# Patient Record
Sex: Male | Born: 1985 | Race: Black or African American | Hispanic: No | Marital: Single | Smoking: Light tobacco smoker
Health system: Southern US, Community
[De-identification: ages and names within clinical notes are randomized; demographics above are authoritative.]

## PROBLEM LIST (undated history)

## (undated) HISTORY — PX: ADENOIDECTOMY: SUR15

## (undated) HISTORY — PX: EYE SURGERY: SHX253

---
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
## 2008-07-29 ENCOUNTER — Emergency Department (HOSPITAL_COMMUNITY): Admission: EM | Admit: 2008-07-29 | Discharge: 2008-07-29 | Payer: Self-pay | Admitting: Emergency Medicine

## 2009-09-25 ENCOUNTER — Emergency Department (HOSPITAL_COMMUNITY): Admission: EM | Admit: 2009-09-25 | Discharge: 2009-09-25 | Payer: Self-pay | Admitting: Emergency Medicine

## 2011-02-18 ENCOUNTER — Emergency Department (HOSPITAL_COMMUNITY)
Admission: EM | Admit: 2011-02-18 | Discharge: 2011-02-18 | Disposition: A | Discharge status: Home / Self Care | Payer: Self-pay | Attending: Emergency Medicine | Admitting: Emergency Medicine

## 2011-02-18 DIAGNOSIS — M542 Cervicalgia: Secondary | ICD-10-CM | POA: Insufficient documentation

## 2011-02-18 DIAGNOSIS — R070 Pain in throat: Secondary | ICD-10-CM | POA: Insufficient documentation

## 2011-02-18 DIAGNOSIS — M25519 Pain in unspecified shoulder: Secondary | ICD-10-CM | POA: Insufficient documentation
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
## 2011-09-17 LAB — CULTURE, ROUTINE-ABSCESS

## 2014-10-29 ENCOUNTER — Encounter (HOSPITAL_BASED_OUTPATIENT_CLINIC_OR_DEPARTMENT_OTHER): Payer: Self-pay | Admitting: Emergency Medicine

## 2014-10-29 ENCOUNTER — Emergency Department (HOSPITAL_BASED_OUTPATIENT_CLINIC_OR_DEPARTMENT_OTHER)
Admission: EM | Admit: 2014-10-29 | Discharge: 2014-10-29 | Disposition: A | Discharge status: Home / Self Care | Payer: Self-pay | Attending: Emergency Medicine | Admitting: Emergency Medicine

## 2014-10-29 DIAGNOSIS — R110 Nausea: Secondary | ICD-10-CM | POA: Insufficient documentation

## 2014-10-29 DIAGNOSIS — H9209 Otalgia, unspecified ear: Secondary | ICD-10-CM | POA: Insufficient documentation

## 2014-10-29 DIAGNOSIS — X58XXXA Exposure to other specified factors, initial encounter: Secondary | ICD-10-CM | POA: Insufficient documentation

## 2014-10-29 DIAGNOSIS — Z791 Long term (current) use of non-steroidal anti-inflammatories (NSAID): Secondary | ICD-10-CM | POA: Insufficient documentation

## 2014-10-29 DIAGNOSIS — Y998 Other external cause status: Secondary | ICD-10-CM | POA: Insufficient documentation

## 2014-10-29 DIAGNOSIS — S025XXA Fracture of tooth (traumatic), initial encounter for closed fracture: Secondary | ICD-10-CM | POA: Insufficient documentation

## 2014-10-29 DIAGNOSIS — Y9389 Activity, other specified: Secondary | ICD-10-CM | POA: Insufficient documentation

## 2014-10-29 DIAGNOSIS — K0889 Other specified disorders of teeth and supporting structures: Secondary | ICD-10-CM

## 2014-10-29 DIAGNOSIS — K029 Dental caries, unspecified: Secondary | ICD-10-CM | POA: Insufficient documentation

## 2014-10-29 DIAGNOSIS — R51 Headache: Secondary | ICD-10-CM | POA: Insufficient documentation

## 2014-10-29 DIAGNOSIS — Y9289 Other specified places as the place of occurrence of the external cause: Secondary | ICD-10-CM | POA: Insufficient documentation

## 2014-10-29 MED ORDER — TRAMADOL HCL 50 MG PO TABS: 50.0000 mg | ORAL_TABLET | Freq: Four times a day (QID) | ORAL | Status: DC | PRN

## 2014-10-29 MED ORDER — NAPROXEN 375 MG PO TABS: 375.0000 mg | ORAL_TABLET | Freq: Two times a day (BID) | ORAL | Status: DC | PRN
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
## 2014-10-29 NOTE — ED Provider Notes (Signed)
CSN: [id number]     Arrival date & time 10/29/14  1805 History   This chart was scribed for Clinton SkeensJoshua M Demarqus Jocson, MD by Evon Slackerrance Branch, ED Scribe. This patient was seen in room MH04/MH04 and the patient's care was started at 6:07 PM.      Chief Complaint  Patient presents with  . Dental Injury   Patient is a 28 y.o. male presenting with dental injury. The history is provided by the patient. No language interpreter was used.  Dental Injury Associated symptoms include headaches.   HPI Comments: [name] is a 28 y.o. male who presents to the Emergency Department complaining of upper right dental pain onset today after eating pizza. He states that there was a bone in the tooth that stabbed him in his tooth. He states that he had associated nausea, headache and ear pain. He states that he was given pain medication by EMS that has provided him some relief. Denies fever chills or vomiting.    History reviewed. No pertinent past medical history. Past Surgical History  Procedure Laterality Date  . Tonsillectomy     No family history on file. History  Substance Use Topics  . Smoking status: Never Smoker   . Smokeless tobacco: Not on file  . Alcohol Use: No    Review of Systems  Constitutional: Negative for fever and chills.  HENT: Positive for dental problem and ear pain.   Gastrointestinal: Positive for nausea. Negative for vomiting.  Neurological: Positive for headaches.  All other systems reviewed and are negative.   Allergies  Review of patient's allergies indicates no known allergies.  Home Medications   Prior to Admission medications   Medication Sig Start Date End Date Taking? Authorizing Provider  naproxen (NAPROSYN) 375 MG tablet Take 1 tablet (375 mg total) by mouth 2 (two) times daily as needed. 10/29/14   Clinton SkeensJoshua M Kalila Adkison, MD  traMADol (ULTRAM) 50 MG tablet Take 1 tablet (50 mg total) by mouth every 6 (six) hours as needed. 10/29/14   Clinton SkeensJoshua M Jazzmin Newbold, MD   Triage  Vitals: BP 135/69 mmHg  Pulse 92  Temp(Src) 98.6 F (37 C) (Oral)  Resp 20  Ht 5\' 8"  (1.727 m)  Wt 285 lb (129.275 kg)  BMI 43.34 kg/m2  SpO2 100%  Physical Exam  Constitutional: He is oriented to person, place, and time. He appears well-developed and well-nourished. No distress.  HENT:  Head: Normocephalic and atraumatic.  Mouth/Throat: No trismus in the jaw. Dental caries present.  Multiple cavities, fractured tooth 2nd molar on right upper, no significant swelling, no bleeding    Eyes: Conjunctivae and EOM are normal.  Neck: Neck supple. No tracheal deviation present.  Cardiovascular: Normal rate.   Pulmonary/Chest: Effort normal. No respiratory distress.  Musculoskeletal: Normal range of motion.  Neurological: He is alert and oriented to person, place, and time.  Skin: Skin is warm and dry.  Psychiatric: He has a normal mood and affect. His behavior is normal.  Nursing note and vitals reviewed.   ED Course  Procedures (including critical care time) DIAGNOSTIC STUDIES: Oxygen Saturation is 100% on RA, normal by my interpretation.    Labs Review Labs Reviewed - No data to display  Imaging Review No results found.   EKG Interpretation None      MDM   Final diagnoses:  Fracture, tooth, closed, initial encounter  Pain, dental    I personally performed the services described in this documentation, which was scribed in my presence. The  recorded information has been reviewed and is accurate.  Results and differential diagnosis were discussed with the patient/parent/guardian. Close follow up outpatient was discussed, comfortable with the plan.   Medications - No data to display  Filed Vitals:   10/29/14 1759 10/29/14 1948  BP: 135/69 129/73  Pulse: 92 88  Temp: 98.6 F (37 C) 98.3 F (36.8 C)  TempSrc: Oral Oral  Resp: 20 18  Height: 5\' 8"  (1.727 m)   Weight: 285 lb (129.275 kg)   SpO2: 100% 100%    Final diagnoses:  Fracture, tooth, closed, initial  encounter  Pain, dental      Clinton SkeensJoshua M Jacoba Cherney, MD 10/30/14 [phone]

## 2014-10-29 NOTE — Discharge Instructions (Signed)
°Emergency Department Resource Guide °1) Find a Doctor and Pay Out of Pocket °Although you won't have to find out who is covered by your insurance plan, it is a good idea to ask around and get recommendations. You will then need to call the office and see if the doctor you have chosen will accept you as a new patient and what types of options they offer for patients who are self-pay. Some doctors offer discounts or will set up payment plans for their patients who do not have insurance, but you will need to ask so you aren't surprised when you get to your appointment. ° °2) Contact Your Local Health Department °Not all health departments have doctors that can see patients for sick visits, but many do, so it is worth a call to see if yours does. If you don't know where your local health department is, you can check in your phone book. The CDC also has a tool to help you locate your state's health department, and many state websites also have listings of all of their local health departments. ° °3) Find a Walk-in Clinic °If your illness is not likely to be very severe or complicated, you may want to try a walk in clinic. These are popping up all over the country in pharmacies, drugstores, and shopping centers. They're usually staffed by nurse practitioners or physician assistants that have been trained to treat common illnesses and complaints. They're usually fairly quick and inexpensive. However, if you have serious medical issues or chronic medical problems, these are probably not your best option. ° °No Primary Care Doctor: °- Call [facility] at  832-8000 - they can help you locate a primary care doctor that  accepts your insurance, provides certain services, etc. °- Physician Referral Service- 1-800-533-3463 ° °Chronic Pain Problems: °Organization         Address  Phone   Notes  °[facility]  (336) 297-2271 Patients need to be referred by their primary care doctor.  ° °Medication  Assistance: °Organization         Address  Phone   Notes  °[place] [facility] [address] °[place], [place] [address] (336) 641-8030 --Must be a resident of [place] °-- Must have NO insurance coverage whatsoever (no Medicaid/ Medicare, etc.) °-- The pt. MUST have a primary care doctor that directs their care regularly and follows them in the community °  °[facility]  (866) 331-1348   °[employer]  (888) 892-1162   ° °Agencies that provide inexpensive medical care: °Organization         Address  Phone   Notes  °[facility]  (336) 832-8035   °[facility]    (336) 832-7272   °[facility] [address] °[place],  [address] (336) 832-4777   °[facility] [address], °[place] (336) 271-4999   °[facility]    (336) 373-0678   °[facility]    (336) 272-1050   °[facility] ° [address], [place] Phone:  (336) 832-4444, Fax:  (336) 832-4440 Hours of Operation:  9 am - 6 pm, M-F.  Also accepts Medicaid/Medicare and self-pay.  °[facility] ° [address],  Phone: (336) 832-3150, Fax: (336) 832-3151. Hours of Operation:  8:30 am - 5:30 pm, M-F.  Also accepts Medicaid and self-pay.  °[facility] [place] [address], [place] Phone: (336) 878-6027   °[facility] [address], [place], [place] (336)723-1848, Ext. 123 Mondays & Thursdays: 7-9 AM.  First 15 patients are seen on a first come, first serve basis. °  ° °Medicaid-accepting [place] Providers: ° °Organization         Address  Phone   Notes  °[facility] [address], [place] (336) 641-2100 Also accepts self-pay patients.  °[facility] [address], [place] ° (336) 856-9996   °[facility] [address], [place]  (336) 288-8857   °[facility] [address] [place] [address], [place] (336) 299-7000   °Veita Bland [address], [place]  ° (336) 373-1557 Only accepts [place] Access Medicaid patients after they have their name applied to their card.  ° °Self-Pay (no insurance) in [place]: ° °Organization         Address  Phone   Notes  °Sickle Cell Patients, [facility] [address], [place] (336) 832-1970   °[facility] [address], [place] (336) 832-4400   °[facility] ° [address], [place] (336) 992-4800   °[facility]/Dr. Osei-Bonsu ° [address] [place] [address], [place] or [address], [place] (336) 841-8500 Phone number for both [place] and [place] locations is the same.  °[facility] [address], [place] (336) 299-0000   °[facility] [address] [place] [address], [place] or [address] (336) 852-7530 °(336) 878-2260   °[facility] [address], [place] (336) 350-1642, phone; (336) 294-5005, fax Sees patients 1st and 3rd Saturday of every month.  Must not qualify for public or private insurance (i.e. Medicaid, Medicare, [place] Health Choice, Veterans' Benefits) • Household income should be no more than 200% of the poverty level •The clinic cannot treat you if you are pregnant or think you are pregnant • Sexually transmitted diseases are not treated at the clinic.  ° ° °Dental Care: °Organization         Address  Phone  Notes  °[place] [facility] [address], [place] (336) 641-6152 Accepts children up to age 21 who are enrolled in Medicaid or [place] Health Choice; pregnant women with a Medicaid card; and children who have applied for Medicaid or [place] Health Choice, but were declined, whose parents can pay a reduced fee at time of service.  °[place]  [facility] [place]  [address], [place] (336) 641-7733 Accepts children up to age 21 who are enrolled in Medicaid or [place] Health Choice; pregnant women with a Medicaid card; and children who have applied for Medicaid or [place] Health Choice, but were declined, whose parents can pay a reduced fee at time of service.  °[facility] ° [address], [place] (336) 641-4533 Patients are seen by appointment only. Walk-ins are not accepted. [facility] will see patients 18 years of age and older. °Monday - Tuesday (8am-5pm) °Most Wednesdays (8:30-5pm) °$30 per visit, cash only  °[facility] ° [address], [place] (336) 641-4533 Patients are seen by appointment only. Walk-ins are not accepted. [facility] will see patients 18 years of age and older. °One   Wednesday Evening (Monthly: Volunteer Based).  $30 per visit, cash only  °[facility]  (919) 537-3737 for adults; Children under age 4, call Graduate Pediatric Dentistry at (919) [phone]. Children aged 4-14, please call (919) [phone] to request a pediatric application. ° Dental services are provided in all areas of dental care including fillings, crowns and bridges, complete and partial dentures, implants, gum treatment, root canals, and extractions. Preventive care is also provided. Treatment is provided to both adults and children. °Patients are selected via a lottery and there is often a waiting list. °  °[facility] [address], °[place] ° (336) 763-8833 [email or web address] °  °[facility] [address], [place], [place] (336)723-1848, Ext. 123 Second and Fourth Thursday of each month, opens at 6:30 AM; Clinic ends at 9 AM.  Patients are seen on a first-come first-served basis, and a limited number are seen during each clinic.  ° °[facility] ° [address], [place], [place] (336) 723-7904    Eligibility Requirements °You must have lived in [place], or [place] counties for at least the last three months. °  You cannot be eligible for state or federal sponsored healthcare insurance, including Veterans Administration, Medicaid, or Medicare. °  You generally cannot be eligible for healthcare insurance through your employer.  °  How to apply: °Eligibility screenings are held every Tuesday and Wednesday afternoon from 1:00 pm until 4:00 pm. You do not need an appointment for the interview!  °[facility] [address], [place], [place] 336-631-2330   °[facility]  336-342-8273   °[facility]  336-703-3100   °[facility]  336-570-6415   ° °Behavioral Health Resources in the Community: °Intensive Outpatient Programs °Organization         Address  Phone  Notes  °[facility] [address], [place], [place] 336-878-6098   °[facility] [address], [place], [place] 336-832-9800   °[facility] [address], [place], [place] ° 336-882-2125   °[facility] [address],  °[place], [place] 1-800-853-5163 or 336-641-4981   °Substance Abuse Resources °Organization         Address  Phone  Notes  °[facility]  336-882-2125   °[facility]  336-784-9470   °[facility]  336-285-9073   °[facility]  336-845-3988   °Residential & Outpatient Substance Abuse Program  1-800-659-3381   °Psychological Services °Organization         Address  Phone  Notes  °[facility]  336- 832-9600   °[facility]  336- 378-7881   °[facility] [address], [place] 1-800-853-5163 or 336-641-4981   ° °Mobile Crisis Teams °Organization         Address  Phone  Notes  °[facility], Mobile Crisis Care Unit  1-877-626-1772   °Assertive °[facility] ° [address]  [place], [place] 336-834-9664   °Sharon DeEsch [address] °[place] 336-554-5454   ° °Self-Help/Support Groups °Organization         Address  Phone             Notes  °[facility] - variety of support groups  336- 373-1402 Call for more information  °Narcotics Anonymous (NA), [facility] [address], °[place]   2 meetings at this location  ° °  Residential Treatment Programs °Organization         Address  Phone  Notes  °[facility] [address],    °[place]  1-866-801-8205   °[facility] ° [address], [place], [place] 704-293-8524   °[facility] [address], [place] 336-845-3988 Admissions: 8am-3pm M-F  °[facility] [address]    °[place], [place] 336-841-1104   °[facility] [address], [place], [place] 336-379-7146   °[facility] [address]  °[place], [place] 336-285-9073   °[facility] Intensive Outpatient [address], [place], [place] 336-852-3033   °[facility] [address]  °[place], [place] 1-877-615-2722 or 336-784-9470   °[facility] [address] [place], [place] 336-227-7417 Accepts Medicaid  °[facility] [address]  °[place] 1-800-659-3381 Substance Abuse/Addiction Treatment  ° °[place] Behavioral Health Resources °Organization         Address  Phone  Notes  °[facility]  (888) 581-9988   °Julie Brannon, PhD [address] [place], [place]   (336) 349-5553 or (336) 951-0000   °[facility]   [address] °[place], [place] (336) 349-4454   °[facility] [address], [place], [place] (336) 342-8316 Insurance/Medicaid/sponsorship through [facility]  °[facility] [address]                                    [place], [place] (336) 342-8316 Therapy/tele-psych/case    °[facility] [address]  ° [place], [place] (336) 349-2233    °Dr. Arfeen  (336) 349-4544   °[facility] [place]  [employer] [place] [facility] 1) [address], [place] °2) [address], [place] °3)  [address], [place] (336) 349-3220 °(336) 342-7768 ° °(336) 342-8140   °[place] [facility] (336) 342-1394 or (336) 342-3537 (After Hours)    ° ° °

## 2014-10-29 NOTE — ED Notes (Signed)
Pt discharged to home with family. NAD.  

## 2014-10-29 NOTE — ED Notes (Signed)
Pre hosp.  IV  RT  AC  # 20  Saline lock     Given 50 fentenyl IV per EMS

## 2015-01-21 ENCOUNTER — Emergency Department (HOSPITAL_COMMUNITY)
Admission: EM | Admit: 2015-01-21 | Discharge: 2015-01-21 | Disposition: A | Discharge status: Home / Self Care | Payer: Self-pay | Attending: Emergency Medicine | Admitting: Emergency Medicine

## 2015-01-21 ENCOUNTER — Encounter (HOSPITAL_COMMUNITY): Payer: Self-pay | Admitting: Emergency Medicine

## 2015-01-21 DIAGNOSIS — K011 Impacted teeth: Secondary | ICD-10-CM | POA: Insufficient documentation

## 2015-01-21 DIAGNOSIS — X58XXXD Exposure to other specified factors, subsequent encounter: Secondary | ICD-10-CM | POA: Insufficient documentation

## 2015-01-21 DIAGNOSIS — K029 Dental caries, unspecified: Secondary | ICD-10-CM | POA: Insufficient documentation

## 2015-01-21 DIAGNOSIS — S025XXK Fracture of tooth (traumatic), subsequent encounter for fracture with nonunion: Secondary | ICD-10-CM | POA: Insufficient documentation

## 2015-01-21 MED ORDER — TRAMADOL HCL 50 MG PO TABS
50.0000 mg | ORAL_TABLET | Freq: Once | ORAL | Status: AC
Start: 2015-01-21 — End: 2015-01-21
  Administered 2015-01-21: 50 mg via ORAL
  Filled 2015-01-21: qty 1

## 2015-01-21 MED ORDER — NAPROXEN 375 MG PO TABS: 375.0000 mg | ORAL_TABLET | Freq: Two times a day (BID) | ORAL | Status: DC | PRN

## 2015-01-21 MED ORDER — TRAMADOL HCL 50 MG PO TABS: 50.0000 mg | ORAL_TABLET | Freq: Four times a day (QID) | ORAL | Status: DC | PRN

## 2015-01-21 MED ORDER — NAPROXEN 500 MG PO TABS
500.0000 mg | ORAL_TABLET | Freq: Once | ORAL | Status: AC
  Administered 2015-01-21: 500 mg via ORAL
  Filled 2015-01-21: qty 1

## 2015-01-21 MED ORDER — NAPROXEN 375 MG PO TABS
375.0000 mg | ORAL_TABLET | Freq: Once | ORAL | Status: DC
  Filled 2015-01-21: qty 1

## 2015-01-21 NOTE — ED Provider Notes (Signed)
CSN: [id number]     Arrival date & time 01/21/15  2120 History  This chart was scribed for non-physician practitioner working with Gilda Creasehristopher J. Pollina, * by Elveria Risingimelie Horne, ED Scribe. This patient was seen in room WTR8/WTR8 and the patient's care was started at 9:36 PM.   Chief Complaint  Patient presents with  . Dental Pain   The history is provided by the patient. No language interpreter was used.   HPI Comments: [name] is a 29 y.o. male who presents to the Emergency Department complaining of right dental pain ongoing for four days. Patient does have dental insurance, but reports plans to "walk in" in the morning.   History reviewed. No pertinent past medical history. Past Surgical History  Procedure Laterality Date  . Tonsillectomy     History reviewed. No pertinent family history. History  Substance Use Topics  . Smoking status: Never Smoker   . Smokeless tobacco: Not on file  . Alcohol Use: No    Review of Systems  Constitutional: Negative for fever and chills.  HENT: Positive for dental problem. Negative for sore throat and trouble swallowing.    Allergies  Review of patient's allergies indicates no known allergies.  Home Medications   Prior to Admission medications   Medication Sig Start Date End Date Taking? Authorizing Provider  acetaminophen (TYLENOL) 500 MG tablet Take 1,000 mg by mouth every 6 (six) hours as needed for moderate pain.   Yes Historical Provider, MD  ibuprofen (ADVIL,MOTRIN) 200 MG tablet Take 400 mg by mouth every 6 (six) hours as needed for moderate pain.   Yes Historical Provider, MD  naproxen (NAPROSYN) 375 MG tablet Take 1 tablet (375 mg total) by mouth 2 (two) times daily as needed. 01/21/15   Arman FilterGail K Dalasia Predmore, NP  traMADol (ULTRAM) 50 MG tablet Take 1 tablet (50 mg total) by mouth every 6 (six) hours as needed. 01/21/15   Arman FilterGail K Trae Bovenzi, NP   Triage Vitals: BP 122/79 mmHg  Pulse 86  Temp(Src) 98.2 F (36.8 C) (Oral)  SpO2 96% Physical  Exam  Constitutional: He is oriented to person, place, and time. He appears well-developed and well-nourished. No distress.  HENT:  Head: Normocephalic and atraumatic.  Mouth/Throat: Oropharynx is clear and moist.    Eyes: EOM are normal.  Neck: Neck supple. No tracheal deviation present.  Cardiovascular: Normal rate.   Pulmonary/Chest: Effort normal. No respiratory distress.  Musculoskeletal: Normal range of motion.  Neurological: He is alert and oriented to person, place, and time.  Skin: Skin is warm and dry.  Psychiatric: He has a normal mood and affect. His behavior is normal.  Nursing note and vitals reviewed.   ED Course  Procedures (including critical care time)  COORDINATION OF CARE: 9:40 PM- Discussed treatment plan with patient at bedside and patient agreed to plan.   Labs Review Labs Reviewed - No data to display  Imaging Review No results found.   EKG Interpretation None      MDM   Final diagnoses:  Dental decay  Fractured tooth, with nonunion, subsequent encounter  Impacted third molar tooth   I personally performed the services described in this documentation, which was scribed in my presence. The recorded information has been reviewed and is accurate.   Arman FilterGail K Donnika Kucher, NP 01/21/15 [id number]  Gilda Creasehristopher J. Pollina, MD 01/21/15 2155

## 2015-01-21 NOTE — ED Notes (Signed)
Patient states that he is having pain from a broken tooth and a wisdom tooth. Alert and oriented.

## 2015-01-21 NOTE — Discharge Instructions (Signed)
Dental Caries Dental caries is tooth decay. This decay can cause a hole in teeth (cavity) that can get bigger and deeper over time. HOME CARE  Brush and floss your teeth. Do this at least two times a day.  Use a fluoride toothpaste.  Use a mouth rinse if told by your dentist or doctor.  Eat less sugary and starchy foods. Drink less sugary drinks.  Avoid snacking often on sugary and starchy foods. Avoid sipping often on sugary drinks.  Keep regular checkups and cleanings with your dentist.  Use fluoride supplements if told by your dentist or doctor.  Allow fluoride to be applied to teeth if told by your dentist or doctor. Document Released: 09/14/2008 Document Revised: 04/22/2014 Document Reviewed: 12/08/2012 [facility] Patient Information 2015 [place], [place]. This information is not intended to replace advice given to you by your health care provider. Make sure you discuss any questions you have with your health care provider.  Dental Pain A tooth ache may be caused by cavities (tooth decay). Cavities expose the nerve of the tooth to air and hot or cold temperatures. It may come from an infection or abscess (also called a boil or furuncle) around your tooth. It is also often caused by dental caries (tooth decay). This causes the pain you are having. DIAGNOSIS  Your caregiver can diagnose this problem by exam. TREATMENT   If caused by an infection, it may be treated with medications which kill germs (antibiotics) and pain medications as prescribed by your caregiver. Take medications as directed.  Only take over-the-counter or prescription medicines for pain, discomfort, or fever as directed by your caregiver.  Whether the tooth ache today is caused by infection or dental disease, you should see your dentist as soon as possible for further care. SEEK MEDICAL CARE IF: The exam and treatment you received today has been provided on an emergency basis only. This is not a substitute for  complete medical or dental care. If your problem worsens or new problems (symptoms) appear, and you are unable to meet with your dentist, call or return to this location. SEEK IMMEDIATE MEDICAL CARE IF:   You have a fever.  You develop redness and swelling of your face, jaw, or neck.  You are unable to open your mouth.  You have severe pain uncontrolled by pain medicine. MAKE SURE YOU:   Understand these instructions.  Will watch your condition.  Will get help right away if you are not doing well or get worse. Document Released: 12/06/2005 Document Revised: 02/28/2012 Document Reviewed: 07/24/2008 [facility] Patient Information 2015 [place], [place]. This information is not intended to replace advice given to you by your health care provider. Make sure you discuss any questions you have with your health care provider. Make sure to keep the dental appointment in the morning

## 2015-12-25 ENCOUNTER — Emergency Department (INDEPENDENT_AMBULATORY_CARE_PROVIDER_SITE_OTHER)
Admission: EM | Admit: 2015-12-25 | Discharge: 2015-12-25 | Disposition: A | Discharge status: Home / Self Care | Payer: PRIVATE HEALTH INSURANCE | Source: Home / Self Care | Attending: Family Medicine | Admitting: Family Medicine

## 2015-12-25 ENCOUNTER — Emergency Department (INDEPENDENT_AMBULATORY_CARE_PROVIDER_SITE_OTHER): Payer: PRIVATE HEALTH INSURANCE

## 2015-12-25 ENCOUNTER — Encounter (HOSPITAL_COMMUNITY): Payer: Self-pay | Admitting: Emergency Medicine

## 2015-12-25 DIAGNOSIS — S93401A Sprain of unspecified ligament of right ankle, initial encounter: Secondary | ICD-10-CM | POA: Diagnosis not present

## 2015-12-25 NOTE — Discharge Instructions (Signed)
Ankle Sprain  An ankle sprain is an injury to the strong, fibrous tissues (ligaments) that hold the bones of your ankle joint together.   CAUSES  An ankle sprain is usually caused by a fall or by twisting your ankle. Ankle sprains most commonly occur when you step on the outer edge of your foot, and your ankle turns inward. People who participate in sports are more prone to these types of injuries.   SYMPTOMS    Pain in your ankle. The pain may be present at rest or only when you are trying to stand or walk.   Swelling.   Bruising. Bruising may develop immediately or within 1 to 2 days after your injury.   Difficulty standing or walking, particularly when turning corners or changing directions.  DIAGNOSIS   Your caregiver will ask you details about your injury and perform a physical exam of your ankle to determine if you have an ankle sprain. During the physical exam, your caregiver will press on and apply pressure to specific areas of your foot and ankle. Your caregiver will try to move your ankle in certain ways. An X-ray exam may be done to be sure a bone was not broken or a ligament did not separate from one of the bones in your ankle (avulsion fracture).   TREATMENT   Certain types of braces can help stabilize your ankle. Your caregiver can make a recommendation for this. Your caregiver may recommend the use of medicine for pain. If your sprain is severe, your caregiver may refer you to a surgeon who helps to restore function to parts of your skeletal system (orthopedist) or a physical therapist.  HOME CARE INSTRUCTIONS    Apply ice to your injury for 1-2 days or as directed by your caregiver. Applying ice helps to reduce inflammation and pain.    Put ice in a plastic bag.    Place a towel between your skin and the bag.    Leave the ice on for 15-20 minutes at a time, every 2 hours while you are awake.   Only take over-the-counter or prescription medicines for pain, discomfort, or fever as directed by  your caregiver.   Elevate your injured ankle above the level of your heart as much as possible for 2-3 days.   If your caregiver recommends crutches, use them as instructed. Gradually put weight on the affected ankle. Continue to use crutches or a cane until you can walk without feeling pain in your ankle.   If you have a plaster splint, wear the splint as directed by your caregiver. Do not rest it on anything harder than a pillow for the first 24 hours. Do not put weight on it. Do not get it wet. You may take it off to take a shower or bath.   You may have been given an elastic bandage to wear around your ankle to provide support. If the elastic bandage is too tight (you have numbness or tingling in your foot or your foot becomes cold and blue), adjust the bandage to make it comfortable.   If you have an air splint, you may blow more air into it or let air out to make it more comfortable. You may take your splint off at night and before taking a shower or bath. Wiggle your toes in the splint several times per day to decrease swelling.  SEEK MEDICAL CARE IF:    You have rapidly increasing bruising or swelling.   Your toes feel   extremely cold or you lose feeling in your foot.   Your pain is not relieved with medicine.  SEEK IMMEDIATE MEDICAL CARE IF:   Your toes are numb or blue.   You have severe pain that is increasing.  MAKE SURE YOU:    Understand these instructions.   Will watch your condition.   Will get help right away if you are not doing well or get worse.     This information is not intended to replace advice given to you by your health care provider. Make sure you discuss any questions you have with your health care provider.     Document Released: 12/06/2005 Document Revised: 12/27/2014 Document Reviewed: 12/18/2011  Elsevier Interactive Patient Education 2016 [employer].

## 2015-12-25 NOTE — ED Provider Notes (Signed)
CSN: [id number]     Arrival date & time 12/25/15  1817 History   First MD Initiated Contact with Patient 12/25/15 1922     Chief Complaint  Patient presents with  . Ankle Pain   (Consider location/radiation/quality/duration/timing/severity/associated sxs/prior Treatment) HPI Comments: 30 year old male twisted his ankle playing football 2 months ago. He is complaining of intermittent ankle pain for that period of time. He has been wrapping it and ambulating. It is worse with weightbearing and ambulation. It has been getting worse over the past 2 days. Pain is located primarily to the lateral aspect.   History reviewed. No pertinent past medical history. Past Surgical History  Procedure Laterality Date  . Tonsillectomy     No family history on file. Social History  Substance Use Topics  . Smoking status: Never Smoker   . Smokeless tobacco: None  . Alcohol Use: No    Review of Systems  Constitutional: Negative.   Respiratory: Negative.   Musculoskeletal: Positive for joint swelling and gait problem.       As per HPI  Skin: Negative.   Neurological: Negative for dizziness, weakness, numbness and headaches.  All other systems reviewed and are negative.   Allergies  Review of patient's allergies indicates no known allergies.  Home Medications   Prior to Admission medications   Medication Sig Start Date End Date Taking? Authorizing Provider  acetaminophen (TYLENOL) 500 MG tablet Take 1,000 mg by mouth every 6 (six) hours as needed for moderate pain.    Historical Provider, MD  ibuprofen (ADVIL,MOTRIN) 200 MG tablet Take 400 mg by mouth every 6 (six) hours as needed for moderate pain.    Historical Provider, MD  naproxen (NAPROSYN) 375 MG tablet Take 1 tablet (375 mg total) by mouth 2 (two) times daily as needed. 01/21/15   Earley FavorGail Schulz, NP  traMADol (ULTRAM) 50 MG tablet Take 1 tablet (50 mg total) by mouth every 6 (six) hours as needed. 01/21/15   Earley FavorGail Schulz, NP   Meds Ordered and  Administered this Visit  Medications - No data to display  BP 117/82 mmHg  Pulse 90  Temp(Src) 97.7 F (36.5 C) (Oral)  Resp 16  SpO2 96% No data found.   Physical Exam  Constitutional: He is oriented to person, place, and time. He appears well-developed and well-nourished.  HENT:  Head: Normocephalic and atraumatic.  Eyes: EOM are normal. Left eye exhibits no discharge.  Neck: Normal range of motion. Neck supple.  Pulmonary/Chest: Effort normal. No respiratory distress.  Musculoskeletal:  Minor swelling and tenderness to the lateral aspect of the right ankle. Most of the tenderness is distal to the fibula. Neurovascular and motor sensory is intact. Pedal pulses 2+. Plantarflexion and dorsiflexion intact.  Neurological: He is alert and oriented to person, place, and time. No cranial nerve deficit.  Skin: Skin is warm and dry.  Psychiatric: He has a normal mood and affect.  Nursing note and vitals reviewed.   ED Course  Procedures (including critical care time)  Labs Review Labs Reviewed - No data to display  Imaging Review Dg Ankle Complete Right  12/25/2015  CLINICAL DATA:  Twisted right ankle 5 weeks ago at football with pain starting 3 days ago. EXAM: RIGHT ANKLE - COMPLETE 3+ VIEW COMPARISON:  None. FINDINGS: There is no evidence of fracture, dislocation, or joint effusion. There is no evidence of arthropathy or other focal bone abnormality. Soft tissues are unremarkable. IMPRESSION: Negative. Electronically Signed   By: Gabriel CarinaWei-Chen  Lin M.D.  On: 12/25/2015 20:20     Visual Acuity Review  Right Eye Distance:   Left Eye Distance:   Bilateral Distance:    Right Eye Near:   Left Eye Near:    Bilateral Near:         MDM   1. Ankle sprain, right, initial encounter    RICE ASO Crutches No weightbearing for the next 3-4 days. Gradually bear weight as tolerated with crutches. No prolonged standing or excessive ambulation for the following 10 days.    Hayden Rasmussen, NP 12/25/15 2035

## 2015-12-25 NOTE — ED Notes (Addendum)
Right ankle pain that started 2 months ago.  Reports playing football and injuring lateral, right ankle.  Right pedal pulse is 2 plus.  Able to move toes.  Bearing weight on ankle is painful

## 2016-10-01 ENCOUNTER — Ambulatory Visit (HOSPITAL_COMMUNITY)
Admission: EM | Admit: 2016-10-01 | Discharge: 2016-10-01 | Disposition: A | Discharge status: Home / Self Care | Payer: PRIVATE HEALTH INSURANCE | Attending: Emergency Medicine | Admitting: Emergency Medicine

## 2016-10-01 ENCOUNTER — Encounter (HOSPITAL_COMMUNITY): Payer: Self-pay | Admitting: Emergency Medicine

## 2016-10-01 DIAGNOSIS — G8929 Other chronic pain: Secondary | ICD-10-CM

## 2016-10-01 DIAGNOSIS — M25571 Pain in right ankle and joints of right foot: Secondary | ICD-10-CM

## 2016-10-01 MED ORDER — DICLOFENAC SODIUM 75 MG PO TBEC: DELAYED_RELEASE_TABLET | ORAL | 0 refills | Status: DC

## 2016-10-01 NOTE — Discharge Instructions (Signed)
Sorry you are having recurrent pain. Take this medication twice daily for the next week to 2 weeks. This will help with pain and inflammation. Would continue with your splint when up and ambulating. Stay off this weekend as much as possible. If you are still having problems then suggest f/u with Orthopedics to have this further imaged and evaluated.

## 2016-10-01 NOTE — ED Provider Notes (Signed)
CSN: [id number]     Arrival date & time 10/01/16  1532 History   First MD Initiated Contact with Patient 10/01/16 1753     Chief Complaint  Patient presents with  . Ankle Pain   (Consider location/radiation/quality/duration/timing/severity/associated sxs/prior Treatment) 30 yo presents with right ankle pain. He had a sprain in January and did well following this. He started a new job that requires more standing and walking and has noted a reoccurrence of pain and swelling. No new injury. Pain with flexion of his foot. No warmth, or erythema is noted. No additional joint pain. He has been wearing his brace without good relief.       History reviewed. No pertinent past medical history. Past Surgical History:  Procedure Laterality Date  . TONSILLECTOMY     History reviewed. No pertinent family history. Social History  Substance Use Topics  . Smoking status: Never Smoker  . Smokeless tobacco: Never Used  . Alcohol use No    Review of Systems  All other systems reviewed and are negative.   Allergies  Review of patient's allergies indicates no known allergies.  Home Medications   Prior to Admission medications   Medication Sig Start Date End Date Taking? Authorizing Provider  acetaminophen (TYLENOL) 500 MG tablet Take 1,000 mg by mouth every 6 (six) hours as needed for moderate pain.    Historical Provider, MD  diclofenac (VOLTAREN) 75 MG EC tablet Take 1 bid x 2 weeks then prn ankle pain 10/01/16   Riki SheerMichelle G Young, PA-C  ibuprofen (ADVIL,MOTRIN) 200 MG tablet Take 400 mg by mouth every 6 (six) hours as needed for moderate pain.    Historical Provider, MD  naproxen (NAPROSYN) 375 MG tablet Take 1 tablet (375 mg total) by mouth 2 (two) times daily as needed. 01/21/15   Earley FavorGail Schulz, NP  traMADol (ULTRAM) 50 MG tablet Take 1 tablet (50 mg total) by mouth every 6 (six) hours as needed. 01/21/15   Earley FavorGail Schulz, NP   Meds Ordered and Administered this Visit  Medications - No data to  display  BP 125/71 (BP Location: Left Arm)   Pulse 77   Temp 98.3 F (36.8 C) (Oral)   Resp 12   SpO2 100%  No data found.   Physical Exam  Constitutional: He is oriented to person, place, and time. He appears well-developed and well-nourished. No distress.  Musculoskeletal: He exhibits edema and tenderness. He exhibits no deformity.  Right ankle with mild soft tissue swelling laterally and tenderness to palpation along the lateral malleolus. Full ROM and good strength, pain with flexion. Pulses intact  Neurological: He is alert and oriented to person, place, and time.  Skin: Skin is warm and dry. He is not diaphoretic. No erythema.  Psychiatric: His behavior is normal.  Nursing note and vitals reviewed.   Urgent Care Course   Clinical Course    Procedures (including critical care time)  Labs Review Labs Reviewed - No data to display  Imaging Review No results found.   Visual Acuity Review  Right Eye Distance:   Left Eye Distance:   Bilateral Distance:    Right Eye Near:   Left Eye Near:    Bilateral Near:         MDM   1. Chronic pain of right ankle    Acute on chronic ankle pain.  No indication for xray today is noted without a new injury. Suspect increased pain and inflammation with new job duties. Treat symptomatically with NSAIDs,  rest and use of ASO splint. Would suggest further f/u with Orthopedic for further work up and imaging, question torn tendon.    Riki Sheer, PA-C 10/01/16 [phone]

## 2016-10-01 NOTE — ED Triage Notes (Signed)
[facility] a [facility] right ankle pain that started 10 months ago. The patient [facility] and [facility] with [facility]. The patient stated that his new job has re-irritated his injury and in the am he is not able to bear weight on it.

## 2017-05-17 ENCOUNTER — Emergency Department (HOSPITAL_COMMUNITY)
Admission: EM | Admit: 2017-05-17 | Discharge: 2017-05-17 | Discharge status: Against Medical Advice | Payer: PRIVATE HEALTH INSURANCE | Attending: Dermatology | Admitting: Dermatology

## 2017-05-17 ENCOUNTER — Encounter (HOSPITAL_COMMUNITY): Payer: Self-pay

## 2017-05-17 DIAGNOSIS — R109 Unspecified abdominal pain: Secondary | ICD-10-CM | POA: Diagnosis not present

## 2017-05-17 DIAGNOSIS — R079 Chest pain, unspecified: Secondary | ICD-10-CM | POA: Insufficient documentation

## 2017-05-17 DIAGNOSIS — Z5321 Procedure and treatment not carried out due to patient leaving prior to being seen by health care provider: Secondary | ICD-10-CM | POA: Diagnosis not present

## 2017-05-17 DIAGNOSIS — R1110 Vomiting, unspecified: Secondary | ICD-10-CM | POA: Insufficient documentation

## 2017-05-17 NOTE — ED Triage Notes (Addendum)
Pt states he had abdominal pain and vomiting yesterday, possible food poisoning. He reports today he woke up and began having chest pain, vomited approximately 8 times yesterday. Skin warm and dry.

## 2017-10-13 ENCOUNTER — Encounter (HOSPITAL_COMMUNITY): Payer: Self-pay | Admitting: Internal Medicine

## 2017-10-13 ENCOUNTER — Emergency Department (HOSPITAL_COMMUNITY)
Admission: EM | Admit: 2017-10-13 | Discharge: 2017-10-13 | Disposition: A | Discharge status: Home / Self Care | Payer: PRIVATE HEALTH INSURANCE | Attending: Emergency Medicine | Admitting: Emergency Medicine

## 2017-10-13 DIAGNOSIS — T7840XA Allergy, unspecified, initial encounter: Secondary | ICD-10-CM

## 2017-10-13 DIAGNOSIS — R21 Rash and other nonspecific skin eruption: Secondary | ICD-10-CM | POA: Insufficient documentation

## 2017-10-13 LAB — CBC
HCT: 48.2 % (ref 39.0–52.0)
Hemoglobin: 16.8 g/dL (ref 13.0–17.0)
MCH: 29.6 pg (ref 26.0–34.0)
MCHC: 34.9 g/dL (ref 30.0–36.0)
MCV: 85 fL (ref 78.0–100.0)
PLATELETS: 242 10*3/uL (ref 150–400)
RBC: 5.67 MIL/uL (ref 4.22–5.81)
RDW: 13.6 % (ref 11.5–15.5)
WBC: 8.2 10*3/uL (ref 4.0–10.5)

## 2017-10-13 LAB — BASIC METABOLIC PANEL
Anion gap: 11 (ref 5–15)
BUN: 19 mg/dL (ref 6–20)
CALCIUM: 9.3 mg/dL (ref 8.9–10.3)
CO2: 20 mmol/L — ABNORMAL LOW (ref 22–32)
CREATININE: 1.06 mg/dL (ref 0.61–1.24)
Chloride: 104 mmol/L (ref 101–111)
GFR calc Af Amer: >60 mL/min (ref >60)
GLUCOSE: 113 mg/dL — AB (ref 65–99)
Potassium: 5.2 mmol/L — ABNORMAL HIGH (ref 3.5–5.1)
Sodium: 135 mmol/L (ref 135–145)

## 2017-10-13 MED ORDER — DIPHENHYDRAMINE HCL 25 MG PO CAPS: 25.0000 mg | ORAL_CAPSULE | Freq: Four times a day (QID) | ORAL | 0 refills | Status: DC | PRN

## 2017-10-13 MED ORDER — METHYLPREDNISOLONE SODIUM SUCC 125 MG IJ SOLR
125.0000 mg | Freq: Once | INTRAMUSCULAR | Status: AC
  Administered 2017-10-13: 125 mg via INTRAVENOUS
  Filled 2017-10-13: qty 2

## 2017-10-13 MED ORDER — SODIUM CHLORIDE 0.9 % IV BOLUS (SEPSIS)
1000.0000 mL | Freq: Once | INTRAVENOUS | Status: AC
  Administered 2017-10-13: 1000 mL via INTRAVENOUS

## 2017-10-13 NOTE — ED Provider Notes (Signed)
[facility]-EMERGENCY DEPT Provider Note   CSN: [id number] Arrival date & time: 10/13/17  0734     History   Chief Complaint Chief Complaint  Patient presents with  . Urticaria    HPI [name] is a 31 y.o. male.  HPI  31 y.o. male, presents to the Emergency Department today via EMS due to hives. Pt states that last night he started to develop hives when lying on the cough. Decided to call an [facility] and get some Benadryl, which he actually bought motrin instead. Proceeded to take medications and went to sleep. Woke up in the middle of the night and went to bathroom. Felt dizzy and decided to lie down. States that he felt nauseated. Proceeded to call EMS dispatcher. During conversation he "passed out." Woke up to dispatcher saying that someone was on the way. EMS arrived. Noted hives and gave PO benadryl. Pt notes hives still present. No CP/SOB/ABD pain. No headaches. No N/V. No throat swelling or trouble breathing. Notes hives still present. No fevers. No cough/congestion. No other symptoms noted.   No past medical history on file.  There are no active problems to display for this patient.   Past Surgical History:  Procedure Laterality Date  . TONSILLECTOMY         Home Medications    Prior to Admission medications   Medication Sig Start Date End Date Taking? Authorizing Provider  acetaminophen (TYLENOL) 500 MG tablet Take 1,000 mg by mouth every 6 (six) hours as needed for moderate pain.    [provider]  diclofenac (VOLTAREN) 75 MG EC tablet Take 1 bid x 2 weeks then prn ankle pain 10/01/16   Riki Sheer, PA-C  ibuprofen (ADVIL,MOTRIN) 200 MG tablet Take 400 mg by mouth every 6 (six) hours as needed for moderate pain.    [provider]  naproxen (NAPROSYN) 375 MG tablet Take 1 tablet (375 mg total) by mouth 2 (two) times daily as needed. 01/21/15   Earley Favor, NP  traMADol (ULTRAM) 50 MG tablet Take 1 tablet (50 mg  total) by mouth every 6 (six) hours as needed. 01/21/15   Earley Favor, NP    Family History No family history on file.  Social History Social History  Substance Use Topics  . Smoking status: Never Smoker  . Smokeless tobacco: Never Used  . Alcohol use No     Allergies   Patient has no known allergies.   Review of Systems Review of Systems ROS reviewed and all are negative for acute change except as noted in the HPI.  Physical Exam Updated Vital Signs BP 108/84 (BP Location: Left Arm)   Pulse 79   Temp 97.8 F (36.6 C) (Oral)   Resp 17   Ht 5\' 9"  (1.753 m)   Wt 108.9 kg (240 lb)   SpO2 98%   BMI 35.44 kg/m   Physical Exam  Constitutional: He is oriented to person, place, and time. He appears well-developed and well-nourished. No distress.  HENT:  Head: Normocephalic and atraumatic.  Right Ear: Tympanic membrane, external ear and ear canal normal.  Left Ear: Tympanic membrane, external ear and ear canal normal.  Nose: Nose normal.  Mouth/Throat: Uvula is midline, oropharynx is clear and moist and mucous membranes are normal. No trismus in the jaw. No oropharyngeal exudate, posterior oropharyngeal erythema or tonsillar abscesses.  No oropharyngeal edema. Speaking in clear and complete sentences   Eyes: Pupils are equal, round, and reactive to light.  EOM are normal.  Neck: Normal range of motion. Neck supple. No tracheal deviation present.  Cardiovascular: Normal rate, regular rhythm, S1 normal, S2 normal, normal heart sounds, intact distal pulses and normal pulses.   Pulmonary/Chest: Effort normal and breath sounds normal. No respiratory distress. He has no decreased breath sounds. He has no wheezes. He has no rhonchi. He has no rales.  Abdominal: Normal appearance and bowel sounds are normal. There is no tenderness.  Musculoskeletal: Normal range of motion.  Neurological: He is alert and oriented to person, place, and time.  Skin: Skin is warm and dry.  Diffuse  urticarial rash  Psychiatric: He has a normal mood and affect. His speech is normal and behavior is normal. Thought content normal.  Nursing note and vitals reviewed.  ED Treatments / Results  Labs (all labs ordered are listed, but only abnormal results are displayed) Labs Reviewed  CBC  BASIC METABOLIC PANEL    EKG  EKG Interpretation None       Radiology No results found.  Procedures Procedures (including critical care time)  Medications Ordered in ED Medications  sodium chloride 0.9 % bolus 1,000 mL (1,000 mLs Intravenous New Bag/Given 10/13/17 0825)  methylPREDNISolone sodium succinate (SOLU-MEDROL) 125 mg/2 mL injection 125 mg (125 mg Intravenous Given 10/13/17 0829)     Initial Impression / Assessment and Plan / ED Course  I have reviewed the triage vital signs and the nursing notes.  Pertinent labs & imaging results that were available during my care of the patient were reviewed by me and considered in my medical decision making (see chart for details).  Final Clinical Impressions(s) / ED Diagnoses     {I have reviewed the relevant previous healthcare records.  {I obtained HPI from historian.   ED Course:  Assessment: Pt is a 31 y.o. male presents to the Emergency Department today via EMS due to hives. Pt states that last night he started to develop hives when lying on the cough. Decided to call an [facility] and get some Benadryl, which he actually bought motrin instead. Proceeded to take medications and went to sleep. Woke up in the middle of the night and went to bathroom. Felt dizzy and decided to lie down. States that he felt nauseated. Proceeded to call EMS dispatcher. During conversation he "passed out." Woke up to dispatcher saying that someone was on the way. EMS arrived. Noted hives and gave PO benadryl. Pt notes hives still present. No CP/SOB/ABD pain. No headaches. No N/V. No throat swelling or trouble breathing. Notes hives still present. No fevers. No  cough/congestion. On exam, pt in NAD. Nontoxic/nonseptic appearing. VSS. Afebrile. Lungs CTA. Heart RRR. Abdomen nontender soft. Diffuse urticarial rash. No signs of airway compromise. Labs unremarkable. Given fluids in ED. Orthostatics obtained. Given Solumedrol for hives. Improved in ED. Doubt true syncope. Pt likely fell asleep while speaking with dispatcher. Strict return precautions given. Plan is to DC home with follow up to PCP. At time of discharge, Patient is in no acute distress. Vital Signs are stable. Patient is able to ambulate. Patient able to tolerate PO.   Disposition/Plan:  DC Home Additional Verbal discharge instructions given and discussed with patient.  Pt Instructed to f/u with PCP in the next week for evaluation and treatment of symptoms. Return precautions given Pt acknowledges and agrees with plan  Supervising Physician Azalia Bilisampos, Kevin, MD  Final diagnoses:  Allergic reaction, initial encounter    New Prescriptions New Prescriptions   No medications on file  Audry Pili, PA-C 10/13/17 1610    Azalia Bilis, MD 10/13/17 1050

## 2017-10-13 NOTE — ED Notes (Signed)
Bed: [id number] Expected date:  Expected time:  Means of arrival:  Comments: 31 yo m hives, syncopal

## 2017-10-13 NOTE — Discharge Instructions (Signed)
Please read and follow all provided instructions.  Your diagnoses today include:  1. Allergic reaction, initial encounter     Tests performed today include: Vital signs. See below for your results today.   Medications prescribed:  Take as prescribed   Home care instructions:  Follow any educational materials contained in this packet.  Follow-up instructions: Please follow-up with your primary care provider for further evaluation of symptoms and treatment   Return instructions:  Please return to the Emergency Department if you do not get better, if you get worse, or new symptoms OR  - Fever (temperature greater than 101.77F)  - Bleeding that does not stop with holding pressure to the area    -Severe pain (please note that you may be more sore the day after your accident)  - Chest Pain  - Difficulty breathing  - Severe nausea or vomiting  - Inability to tolerate food and liquids  - Passing out  - Skin becoming red around your wounds  - Change in mental status (confusion or lethargy)  - New numbness or weakness    Please return if you have any other emergent concerns.  Additional Information:  Your vital signs today were: BP 109/68    Pulse 78    Temp 97.8 F (36.6 C) (Oral)    Resp 16    Ht 5\' 9"  (1.753 m)    Wt 108.9 kg (240 lb)    SpO2 98%    BMI 35.44 kg/m  If your blood pressure (BP) was elevated above 135/85 this visit, please have this repeated by your doctor within one month. ---------------

## 2017-10-13 NOTE — ED Notes (Signed)
ED Provider at bedside. 

## 2017-10-13 NOTE — ED Triage Notes (Signed)
Pt called EMS this morning, would not talk to the dispatcher, then reportedly passed out. Per EMS pt has hives since midnight. Pt took ibuprofen for the hives, EMS gave pt PO benadryl. Pt denies chest pain/shortness of breath.

## 2018-02-20 ENCOUNTER — Other Ambulatory Visit: Payer: Self-pay

## 2018-02-20 ENCOUNTER — Encounter (HOSPITAL_COMMUNITY): Payer: Self-pay | Admitting: Emergency Medicine

## 2018-02-20 ENCOUNTER — Emergency Department (HOSPITAL_COMMUNITY): Payer: Self-pay

## 2018-02-20 ENCOUNTER — Emergency Department (HOSPITAL_COMMUNITY)
Admission: EM | Admit: 2018-02-20 | Discharge: 2018-02-21 | Disposition: A | Discharge status: Home / Self Care | Payer: Self-pay | Attending: Emergency Medicine | Admitting: Emergency Medicine

## 2018-02-20 DIAGNOSIS — Y998 Other external cause status: Secondary | ICD-10-CM | POA: Insufficient documentation

## 2018-02-20 DIAGNOSIS — Y929 Unspecified place or not applicable: Secondary | ICD-10-CM | POA: Insufficient documentation

## 2018-02-20 DIAGNOSIS — S0232XA Fracture of orbital floor, left side, initial encounter for closed fracture: Secondary | ICD-10-CM | POA: Insufficient documentation

## 2018-02-20 DIAGNOSIS — S0502XA Injury of conjunctiva and corneal abrasion without foreign body, left eye, initial encounter: Secondary | ICD-10-CM | POA: Insufficient documentation

## 2018-02-20 DIAGNOSIS — Y939 Activity, unspecified: Secondary | ICD-10-CM | POA: Insufficient documentation

## 2018-02-20 MED ORDER — FLUORESCEIN SODIUM 1 MG OP STRP
1.0000 | ORAL_STRIP | Freq: Once | OPHTHALMIC | Status: AC
  Administered 2018-02-21: 1 via OPHTHALMIC
  Filled 2018-02-20: qty 1

## 2018-02-20 MED ORDER — TETRACAINE HCL 0.5 % OP SOLN
2.0000 [drp] | Freq: Once | OPHTHALMIC | Status: AC
  Administered 2018-02-21: 2 [drp] via OPHTHALMIC
  Filled 2018-02-20: qty 4
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
## 2018-02-20 NOTE — ED Notes (Signed)
Patient transported to CT 

## 2018-02-21 MED ORDER — ERYTHROMYCIN 5 MG/GM OP OINT: TOPICAL_OINTMENT | OPHTHALMIC | 0 refills | Status: DC

## 2018-02-21 NOTE — Discharge Instructions (Signed)
Apply antibiotic ointment 4 times daily as instructed to the left lower eyelid.  Apply cool compresses or ice packs to the eye to help with bruising and swelling. Alternate 600 mg of ibuprofen and [phone] mg of Tylenol every 3 hours as needed for pain. Do not exceed 4000 mg of Tylenol daily. Dr. Laban EmperorZamora's office will call you tomorrow to set up a follow-up appointment for either tomorrow or Wednesday.  If you do not hear from Dr. Laban EmperorZamora's office by tomorrow you need to call them before the end of business tomorrow.  Return to the emergency department if any worsening signs or symptoms develop such as vision loss, inability to move the eye or severe pain with eye movements.
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
## 2018-02-22 ENCOUNTER — Ambulatory Visit (INDEPENDENT_AMBULATORY_CARE_PROVIDER_SITE_OTHER): Payer: Self-pay | Admitting: Ophthalmology

## 2018-02-22 ENCOUNTER — Encounter (INDEPENDENT_AMBULATORY_CARE_PROVIDER_SITE_OTHER): Payer: Self-pay | Admitting: Ophthalmology

## 2018-02-22 DIAGNOSIS — S0232XB Fracture of orbital floor, left side, initial encounter for open fracture: Secondary | ICD-10-CM

## 2018-02-22 DIAGNOSIS — H3581 Retinal edema: Secondary | ICD-10-CM

## 2018-02-22 DIAGNOSIS — Z9889 Other specified postprocedural states: Secondary | ICD-10-CM

## 2018-02-22 DIAGNOSIS — Z8781 Personal history of (healed) traumatic fracture: Secondary | ICD-10-CM

## 2018-02-22 NOTE — Progress Notes (Signed)
This document serves as a record of services personally performed by Karie ChimeraBrian G. Zamora, MD, PhD. It was created on their behalf by Virgilio BellingMeredith Fabian, COA, a certified ophthalmic assistant. The creation of this record is the provider's dictation and/or activities during the visit.  Electronically signed by: Virgilio BellingMeredith Fabian, COA  02/22/18 2:39 PM  I have reviewed the above documentation for accuracy and completeness, and I agree with the above. Karie ChimeraBrian G. Zamora, M.D., Ph.D. 02/22/18 2:40 PM

## 2018-08-17 ENCOUNTER — Ambulatory Visit (HOSPITAL_COMMUNITY)
Admission: EM | Admit: 2018-08-17 | Discharge: 2018-08-17 | Disposition: A | Discharge status: Home / Self Care | Payer: Self-pay | Attending: Family Medicine | Admitting: Family Medicine

## 2018-08-17 ENCOUNTER — Encounter (HOSPITAL_COMMUNITY): Payer: Self-pay | Admitting: Emergency Medicine

## 2018-08-17 DIAGNOSIS — H6981 Other specified disorders of Eustachian tube, right ear: Secondary | ICD-10-CM

## 2018-08-17 NOTE — ED Provider Notes (Signed)
[facility]    CSN: [id number] Arrival date & time: 08/17/18  1644     History   Chief Complaint Chief Complaint  Patient presents with  . Nasal Congestion  . Otalgia    HPI [name] is a 32 y.o. male.   HPI  Here for ear pain R, decreased hearing, feeling a "bubbling" of the ear, post nasal drip, "mucous" in back of throat and chest.  The drainage and mucous have been " all my life".  Thinks he has allergies.  Takes zyrtec daily and sudafed as needed.  Has never seen an ENT or an allergist. No sinus pressure or pain.  NO purulent discharge No asthma or wheezing Non smoker   History reviewed. No pertinent past medical history.  There are no active problems to display for this patient.   Past Surgical History:  Procedure Laterality Date  . ADENOIDECTOMY    . EYE SURGERY         Home Medications    Prior to Admission medications   Not on File    Family History Family History  Problem Relation Age of Onset  . Diabetes Mother   . Blindness Maternal Aunt     Social History Social History   Tobacco Use  . Smoking status: Never Smoker  . Smokeless tobacco: Never Used  Substance Use Topics  . Alcohol use: No  . Drug use: No     Allergies   Patient has no known allergies.   Review of Systems Review of Systems  Constitutional: Negative for chills and fever.  HENT: Positive for ear pain, hearing loss, postnasal drip and rhinorrhea. Negative for sore throat and voice change.   Eyes: Negative for photophobia, pain, redness, itching and visual disturbance.  Respiratory: Negative for cough, shortness of breath and wheezing.   Cardiovascular: Negative for chest pain and palpitations.  Gastrointestinal: Negative for abdominal pain and vomiting.  Genitourinary: Negative for dysuria and hematuria.  Musculoskeletal: Negative for arthralgias and back pain.  Skin: Negative for color change and rash.  Neurological: Negative for seizures and  syncope.  All other systems reviewed and are negative.    Physical Exam Triage Vital Signs ED Triage Vitals [08/17/18 1702]  Enc Vitals Group     BP 136/88     Pulse Rate 90     Resp 16     Temp (!) 97.5 F (36.4 C)     Temp src      SpO2 95 %   No data found.  Updated Vital Signs BP 136/88   Pulse 90   Temp (!) 97.5 F (36.4 C)   Resp 16   SpO2 95%      Physical Exam  Constitutional: He appears well-developed and well-nourished. No distress.  HENT:  Head: Normocephalic and atraumatic.  Right Ear: External ear normal.  Left Ear: External ear normal.  Mouth/Throat: Oropharynx is clear and moist.  Right TM bulging, clear fluid, nasal membranes swollen and pink  Eyes: Pupils are equal, round, and reactive to light. Conjunctivae are normal.  Neck: Normal range of motion.  Cardiovascular: Normal rate, regular rhythm and normal heart sounds.  Pulmonary/Chest: Effort normal and breath sounds normal. No respiratory distress. He has no wheezes.  Abdominal: Soft. He exhibits no distension.  Musculoskeletal: Normal range of motion. He exhibits no edema.  Neurological: He is alert.  Skin: Skin is warm and dry.  Psychiatric: He has a normal mood and affect. His behavior is normal.  UC Treatments / Results  Labs (all labs ordered are listed, but only abnormal results are displayed) Labs Reviewed - No data to display  EKG None  Radiology No results found.  Procedures Procedures (including critical care time)  Medications Ordered in UC Medications - No data to display  Initial Impression / Assessment and Plan / UC Course  I have reviewed the triage vital signs and the nursing notes.  Pertinent labs & imaging results that were available during my care of the patient were reviewed by me and considered in my medical decision making (see chart for details).      Final Clinical Impressions(s) / UC Diagnoses   Final diagnoses:  Eustachian tube dysfunction,  right     Discharge Instructions     Make sure you drink plenty of fluids You may continue with the Zyrtec.  Use Sudafed if you feel it helps I recommend a trial of a nasal allergy spray such as Flonase or Nasalcrom.  The generic is fine Use twice a day for the first week or 2, then reduce to once a day   ED Prescriptions    None     Controlled Substance Prescriptions  Controlled Substance Registry consulted? Not Applicable   Eustace Moore, MD 08/17/18 1755

## 2018-08-17 NOTE — Discharge Instructions (Signed)
Make sure you drink plenty of fluids You may continue with the Zyrtec.  Use Sudafed if you feel it helps I recommend a trial of a nasal allergy spray such as Flonase or Nasalcrom.  The generic is fine Use twice a day for the first week or 2, then reduce to once a day

## 2018-08-17 NOTE — ED Triage Notes (Signed)
Pt states "I have a lot of problems with mucous, congestion, I coughing up brown mucous, I think it got in my right ear, when I lean over it feels like someone is pouring water out of my ear". Symptoms x4 weeks.

## 2018-09-19 ENCOUNTER — Encounter (HOSPITAL_COMMUNITY): Payer: Self-pay | Admitting: *Deleted

## 2018-09-19 ENCOUNTER — Ambulatory Visit (HOSPITAL_COMMUNITY)
Admission: EM | Admit: 2018-09-19 | Discharge: 2018-09-19 | Disposition: A | Discharge status: Home / Self Care | Payer: Self-pay | Attending: Family Medicine | Admitting: Family Medicine

## 2018-09-19 ENCOUNTER — Other Ambulatory Visit: Payer: Self-pay

## 2018-09-19 DIAGNOSIS — R369 Urethral discharge, unspecified: Secondary | ICD-10-CM | POA: Insufficient documentation

## 2018-09-19 DIAGNOSIS — R300 Dysuria: Secondary | ICD-10-CM | POA: Insufficient documentation

## 2018-09-19 MED ORDER — CEFTRIAXONE SODIUM 250 MG IJ SOLR
INTRAMUSCULAR | Status: AC
  Filled 2018-09-19: qty 250

## 2018-09-19 MED ORDER — AZITHROMYCIN 250 MG PO TABS
ORAL_TABLET | ORAL | Status: AC
  Filled 2018-09-19: qty 4

## 2018-09-19 MED ORDER — AZITHROMYCIN 250 MG PO TABS
1000.0000 mg | ORAL_TABLET | Freq: Once | ORAL | Status: AC
  Administered 2018-09-19: 1000 mg via ORAL

## 2018-09-19 MED ORDER — CEFTRIAXONE SODIUM 250 MG IJ SOLR
250.0000 mg | Freq: Once | INTRAMUSCULAR | Status: AC
  Administered 2018-09-19: 250 mg via INTRAMUSCULAR

## 2018-09-19 NOTE — ED Provider Notes (Signed)
[facility]    CSN: [id number] Arrival date & time: 09/19/18  1114     History   Chief Complaint Chief Complaint  Patient presents with  . Urinary Tract Infection    HPI [name] is a 32 y.o. male.   Patient is a 32 year old male who presents with 1 week of dysuria and penile discharge.  His symptoms have been constant and remain the same.  He has not taken anything for his symptoms.  He is currently sexually active, unprotected, with multiple partners.  He denies any associated penile swelling, testicle swelling, lesions, fever, chills, body aches, back pain, abdominal pain.   ROS per HPI      History reviewed. No pertinent past medical history.  There are no active problems to display for this patient.   Past Surgical History:  Procedure Laterality Date  . ADENOIDECTOMY    . EYE SURGERY         Home Medications    Prior to Admission medications   Not on File    Family History Family History  Problem Relation Age of Onset  . Diabetes Mother   . Blindness Maternal Aunt     Social History Social History   Tobacco Use  . Smoking status: Never Smoker  . Smokeless tobacco: Never Used  Substance Use Topics  . Alcohol use: No  . Drug use: No     Allergies   Patient has no known allergies.   Review of Systems Review of Systems   Physical Exam Triage Vital Signs ED Triage Vitals  Enc Vitals Group     BP 09/19/18 1141 120/85     Pulse Rate 09/19/18 1141 93     Resp 09/19/18 1141 18     Temp 09/19/18 1141 98.1 F (36.7 C)     Temp Source 09/19/18 1141 Oral     SpO2 09/19/18 1141 94 %     Weight --      Height --      Head Circumference --      Peak Flow --      Pain Score 09/19/18 1143 0     Pain Loc --      Pain Edu? --      Excl. in GC? --    No data found.  Updated Vital Signs BP 120/85   Pulse 93   Temp 98.1 F (36.7 C) (Oral)   Resp 18   SpO2 94%   Visual Acuity Right Eye Distance:   Left Eye  Distance:   Bilateral Distance:    Right Eye Near:   Left Eye Near:    Bilateral Near:     Physical Exam  Constitutional: He is oriented to person, place, and time. He appears well-developed and well-nourished.  Very pleasant. Non toxic or ill appearing.     HENT:  Head: Normocephalic and atraumatic.  Eyes: Conjunctivae are normal.  Neck: Normal range of motion.  Pulmonary/Chest: Effort normal.  Abdominal: Soft. Bowel sounds are normal.  Abdomen soft, non tender. No CVA tenderness. No rebound tenderness.     Musculoskeletal: Normal range of motion.  Neurological: He is alert and oriented to person, place, and time.  Skin: Skin is warm and dry.  Psychiatric: He has a normal mood and affect.  Nursing note and vitals reviewed.    UC Treatments / Results  Labs (all labs ordered are listed, but only abnormal results are displayed) Labs Reviewed  URINE CYTOLOGY ANCILLARY ONLY  EKG None  Radiology No results found.  Procedures Procedures (including critical care time)  Medications Ordered in UC Medications  cefTRIAXone (ROCEPHIN) injection 250 mg (250 mg Intramuscular Given 09/19/18 1228)  azithromycin (ZITHROMAX) tablet 1,000 mg (1,000 mg Oral Given 09/19/18 1228)    Initial Impression / Assessment and Plan / UC Course  I have reviewed the triage vital signs and the nursing notes.  Pertinent labs & imaging results that were available during my care of the patient were reviewed by me and considered in my medical decision making (see chart for details).     Treating prophylactically for gonorrhea and chlamydia Urine sent for cytology Lab results pending Final Clinical Impressions(s) / UC Diagnoses   Final diagnoses:  Dysuria     Discharge Instructions     We will go ahead and treat you today prophylactically for gonorrhea/chlamydia Urine sent for testing we will call with any positive results Please refrain from sexual activity for at least 7  days Follow up as needed for continued or worsening symptoms      ED Prescriptions    None     Controlled Substance Prescriptions [place] Controlled Substance Registry consulted? Not Applicable   Janace Aris, NP 09/19/18 1425

## 2018-09-19 NOTE — Discharge Instructions (Addendum)
We will go ahead and treat you today prophylactically for gonorrhea/chlamydia Urine sent for testing we will call with any positive results Please refrain from sexual activity for at least 7 days Follow up as needed for continued or worsening symptoms

## 2018-09-19 NOTE — ED Triage Notes (Signed)
C/o burning with urination, c/o swelling to his penis at times.

## 2018-09-20 LAB — URINE CYTOLOGY ANCILLARY ONLY
Chlamydia: POSITIVE — AB
Neisseria Gonorrhea: NEGATIVE
Trichomonas: NEGATIVE

## 2018-09-21 ENCOUNTER — Telehealth: Payer: Self-pay

## 2018-09-21 NOTE — Telephone Encounter (Signed)
Chlamydia is positive.  This was treated at the urgent care visit with po zithromax 1g.  Need to educate pt to please refrain from sexual intercourse for 7 days to give the medicine time to work.  Sexual partners need to be notified and tested/treated.  Condoms may reduce risk of reinfection.  Recheck or followup with PCP for further evaluation if symptoms are not improving.  [facility] notified  Number provided not correct. Letter sent.

## 2018-11-21 ENCOUNTER — Encounter (HOSPITAL_COMMUNITY): Payer: Self-pay | Admitting: Emergency Medicine

## 2018-11-21 ENCOUNTER — Ambulatory Visit (HOSPITAL_COMMUNITY)
Admission: EM | Admit: 2018-11-21 | Discharge: 2018-11-21 | Disposition: A | Discharge status: Home / Self Care | Payer: Self-pay | Attending: Family Medicine | Admitting: Family Medicine

## 2018-11-21 DIAGNOSIS — R369 Urethral discharge, unspecified: Secondary | ICD-10-CM | POA: Insufficient documentation

## 2018-11-21 DIAGNOSIS — Z833 Family history of diabetes mellitus: Secondary | ICD-10-CM | POA: Insufficient documentation

## 2018-11-21 DIAGNOSIS — Z113 Encounter for screening for infections with a predominantly sexual mode of transmission: Secondary | ICD-10-CM

## 2018-11-21 DIAGNOSIS — Z202 Contact with and (suspected) exposure to infections with a predominantly sexual mode of transmission: Secondary | ICD-10-CM

## 2018-11-21 DIAGNOSIS — Z7251 High risk heterosexual behavior: Secondary | ICD-10-CM | POA: Insufficient documentation

## 2018-11-21 LAB — POCT URINALYSIS DIP (DEVICE)
BILIRUBIN URINE: NEGATIVE
Glucose, UA: NEGATIVE mg/dL
Ketones, ur: NEGATIVE mg/dL
Nitrite: NEGATIVE
Protein, ur: NEGATIVE mg/dL
Specific Gravity, Urine: >=1.03 (ref 1.005–1.030)
Urobilinogen, UA: 0.2 mg/dL (ref 0.0–1.0)
pH: 6 (ref 5.0–8.0)

## 2018-11-21 MED ORDER — AZITHROMYCIN 250 MG PO TABS
ORAL_TABLET | ORAL | Status: AC
  Filled 2018-11-21: qty 1

## 2018-11-21 MED ORDER — AZITHROMYCIN 250 MG PO TABS
1000.0000 mg | ORAL_TABLET | Freq: Once | ORAL | Status: AC
  Administered 2018-11-21: 1000 mg via ORAL

## 2018-11-21 MED ORDER — CEFTRIAXONE SODIUM 250 MG IJ SOLR
INTRAMUSCULAR | Status: AC
  Filled 2018-11-21: qty 250

## 2018-11-21 MED ORDER — CEFTRIAXONE SODIUM 250 MG IJ SOLR
250.0000 mg | Freq: Once | INTRAMUSCULAR | Status: AC
  Administered 2018-11-21: 250 mg via INTRAMUSCULAR

## 2018-11-21 MED ORDER — STERILE WATER FOR INJECTION IJ SOLN
INTRAMUSCULAR | Status: AC
  Filled 2018-11-21: qty 10

## 2018-11-21 NOTE — ED Provider Notes (Signed)
[facility]   [id number] 11/21/18 Arrival Time: 1331  ASSESSMENT & PLAN:  1. Penile discharge   2. High risk heterosexual behavior    No HIV/RPR testing desired.   Discharge Instructions     You have been given the following medications today for treatment of suspected gonorrhea and/or chlamydia:  cefTRIAXone (ROCEPHIN) injection 250 mg azithromycin (ZITHROMAX) tablet 1,000 mg  Even though we have treated you today, we have sent testing for sexually transmitted infections. We will notify you of any positive results once they are received. If required, we will prescribe any medications you might need.  Please refrain from all sexual activity for at least the next seven days.    Pending: Labs Reviewed  POCT URINALYSIS DIP (DEVICE) - Abnormal; Notable for the following components:      Result Value   Hgb urine dipstick TRACE (*)    Leukocytes, UA SMALL (*)    All other components within normal limits  URINE CYTOLOGY ANCILLARY ONLY    Will notify of any positive results. Instructed to refrain from sexual activity for at least seven days.  Reviewed expectations re: course of current medical issues. Questions answered. Outlined signs and symptoms indicating need for more acute intervention. Patient verbalized understanding. After Visit Summary given.   SUBJECTIVE:  [name] is a 32 y.o. male who presents with complaint of penile discharge. Onset abrupt, a few days ago. Describes discharge as thick and white/yellow. Urinary symptoms: questions mild and occasional dysuria. Afebrile. No abdominal or pelvic pain. No n/v. No rashes or lesions. Sexually active with multiple (at least five) male partners. Reports h/o treated chlamydia.  ROS: As per HPI.  OBJECTIVE:  Vitals:   11/21/18 1403  BP: (!) 141/89  Pulse: 86  Resp: 14  Temp: 98.2 F (36.8 C)  SpO2: 100%     General appearance: alert, cooperative, appears stated age and no  distress Throat: lips, mucosa, and tongue normal; teeth and gums normal Cv: RRR Lungs: CTAB Back: no CVA tenderness; FROM at waist Abdomen: soft, non-tender GU: deferred Skin: warm and dry Psychological: alert and cooperative; normal mood and affect.  Results for orders placed or performed during the hospital encounter of 11/21/18  POCT urinalysis dip (device)  Result Value Ref Range   Glucose, UA NEGATIVE NEGATIVE mg/dL   Bilirubin Urine NEGATIVE NEGATIVE   Ketones, ur NEGATIVE NEGATIVE mg/dL   Specific Gravity, Urine >=1.030 1.005 - 1.030   Hgb urine dipstick TRACE (A) NEGATIVE   pH 6.0 5.0 - 8.0   Protein, ur NEGATIVE NEGATIVE mg/dL   Urobilinogen, UA 0.2 0.0 - 1.0 mg/dL   Nitrite NEGATIVE NEGATIVE   Leukocytes, UA SMALL (A) NEGATIVE    Labs Reviewed  POCT URINALYSIS DIP (DEVICE) - Abnormal; Notable for the following components:      Result Value   Hgb urine dipstick TRACE (*)    Leukocytes, UA SMALL (*)    All other components within normal limits  URINE CYTOLOGY ANCILLARY ONLY    No Known Allergies  PMH: As in HPI.  Family History  Problem Relation Age of Onset  . Diabetes Mother   . Blindness Maternal Aunt    Social History   Socioeconomic History  . Marital status: Single    Spouse name: Not on file  . Number of children: Not on file  . Years of education: Not on file  . Highest education level: Not on file  Occupational History  . Not on file  Social  Needs  . Financial resource strain: Not on file  . Food insecurity:    Worry: Not on file    Inability: Not on file  . Transportation needs:    Medical: Not on file    Non-medical: Not on file  Tobacco Use  . Smoking status: Never Smoker  . Smokeless tobacco: Never Used  Substance and Sexual Activity  . Alcohol use: No  . Drug use: No  . Sexual activity: Not on file  Lifestyle  . Physical activity:    Days per week: Not on file    Minutes per session: Not on file  . Stress: Not on file   Relationships  . Social connections:    Talks on phone: Not on file    Gets together: Not on file    Attends religious service: Not on file    Active member of club or organization: Not on file    Attends meetings of clubs or organizations: Not on file    Relationship status: Not on file  . Intimate partner violence:    Fear of current or ex partner: Not on file    Emotionally abused: Not on file    Physically abused: Not on file    Forced sexual activity: Not on file  Other Topics Concern  . Not on file  Social History Narrative  . Not on file          Mardella LaymanHagler, Genevieve Ritzel, MD 11/21/18 1534

## 2018-11-21 NOTE — Discharge Instructions (Addendum)

## 2018-11-23 ENCOUNTER — Telehealth (HOSPITAL_COMMUNITY): Payer: Self-pay | Admitting: Emergency Medicine

## 2018-11-23 LAB — URINE CYTOLOGY ANCILLARY ONLY
CHLAMYDIA, DNA PROBE: POSITIVE — AB
NEISSERIA GONORRHEA: NEGATIVE
TRICH (WINDOWPATH): NEGATIVE

## 2018-11-23 NOTE — Telephone Encounter (Signed)
Chlamydia is positive.  This was treated at the urgent care visit with po zithromax 1g.  Pt needs education to please refrain from sexual intercourse for 7 days to give the medicine time to work.  Sexual partners need to be notified and tested/treated.  Condoms may reduce risk of reinfection.  Recheck or followup with PCP for further evaluation if symptoms are not improving.  [facility] notified.  Pt contacted and made aware. No further questions.

## 2019-01-30 ENCOUNTER — Encounter (HOSPITAL_COMMUNITY): Payer: Self-pay | Admitting: Emergency Medicine

## 2019-01-30 ENCOUNTER — Other Ambulatory Visit: Payer: Self-pay

## 2019-01-30 ENCOUNTER — Ambulatory Visit (HOSPITAL_COMMUNITY)
Admission: EM | Admit: 2019-01-30 | Discharge: 2019-01-30 | Disposition: A | Discharge status: Home / Self Care | Payer: Self-pay | Attending: Family Medicine | Admitting: Family Medicine

## 2019-01-30 DIAGNOSIS — Z113 Encounter for screening for infections with a predominantly sexual mode of transmission: Secondary | ICD-10-CM

## 2019-01-30 DIAGNOSIS — Z202 Contact with and (suspected) exposure to infections with a predominantly sexual mode of transmission: Secondary | ICD-10-CM | POA: Insufficient documentation

## 2019-01-30 MED ORDER — AZITHROMYCIN 250 MG PO TABS
ORAL_TABLET | ORAL | Status: AC
  Filled 2019-01-30: qty 4

## 2019-01-30 MED ORDER — AZITHROMYCIN 250 MG PO TABS
1000.0000 mg | ORAL_TABLET | Freq: Once | ORAL | Status: AC
  Administered 2019-01-30: 1000 mg via ORAL

## 2019-01-30 MED ORDER — CEFTRIAXONE SODIUM 250 MG IJ SOLR
INTRAMUSCULAR | Status: AC
  Filled 2019-01-30: qty 250

## 2019-01-30 MED ORDER — CEFTRIAXONE SODIUM 250 MG IJ SOLR
250.0000 mg | Freq: Once | INTRAMUSCULAR | Status: AC
  Administered 2019-01-30: 250 mg via INTRAMUSCULAR

## 2019-01-30 NOTE — ED Provider Notes (Signed)
[facility]   [id number] 01/30/19 Arrival Time: [id number]  ASSESSMENT & PLAN:  1. Possible exposure to STD    Declines HIV/RPR.   Discharge Instructions     You have been given the following medications today for treatment of suspected gonorrhea and/or chlamydia:  cefTRIAXone (ROCEPHIN) injection 250 mg azithromycin (ZITHROMAX) tablet 1,000 mg  Even though we have treated you today, we have sent testing for sexually transmitted infections. We will notify you of any positive results once they are received. If required, we will prescribe any medications you might need.  Please refrain from all sexual activity for at least the next seven days.    Pending: Labs Reviewed  URINE CYTOLOGY ANCILLARY ONLY    Will notify of any positive results. Instructed to refrain from sexual activity for at least seven days.  Reviewed expectations re: course of current medical issues. Questions answered. Outlined signs and symptoms indicating need for more acute intervention. Patient verbalized understanding. After Visit Summary given.   SUBJECTIVE:  [name] is a 33 y.o. male who presents with possible exposure to STI. Girlfriend + for chlamydia. Informed him yesterday. He is without urinary symptoms. No penile d/c. Feeling well. Afebrile. No abdominal or pelvic pain. No n/v. No rashes or lesions. Sexually active with single male partner. Was tx here in 11/2018 for chlamydia.  ROS: As per HPI.  OBJECTIVE:  Vitals:   01/30/19 1105  BP: 119/73  Pulse: 74  Resp: 18  Temp: 98 F (36.7 C)  TempSrc: Oral  SpO2: 97%     General appearance: alert, cooperative, appears stated age and no distress Throat: lips, mucosa, and tongue normal; teeth and gums normal CV: RRR Lungs: CTAB Back: no CVA tenderness; FROM at waist Abdomen: soft, non-tender GU: deferred Skin: warm and dry Psychological: alert and cooperative; normal mood and affect.    Labs Reviewed  URINE  CYTOLOGY ANCILLARY ONLY    No Known Allergies   Family History  Problem Relation Age of Onset  . Diabetes Mother   . Blindness Maternal Aunt    Social History   Socioeconomic History  . Marital status: Single    Spouse name: Not on file  . Number of children: Not on file  . Years of education: Not on file  . Highest education level: Not on file  Occupational History  . Not on file  Social Needs  . Financial resource strain: Not on file  . Food insecurity:    Worry: Not on file    Inability: Not on file  . Transportation needs:    Medical: Not on file    Non-medical: Not on file  Tobacco Use  . Smoking status: Never Smoker  . Smokeless tobacco: Never Used  Substance and Sexual Activity  . Alcohol use: No  . Drug use: No  . Sexual activity: Not on file  Lifestyle  . Physical activity:    Days per week: Not on file    Minutes per session: Not on file  . Stress: Not on file  Relationships  . Social connections:    Talks on phone: Not on file    Gets together: Not on file    Attends religious service: Not on file    Active member of club or organization: Not on file    Attends meetings of clubs or organizations: Not on file    Relationship status: Not on file  . Intimate partner violence:    Fear of current or ex partner:  Not on file    Emotionally abused: Not on file    Physically abused: Not on file    Forced sexual activity: Not on file  Other Topics Concern  . Not on file  Social History Narrative  . Not on file          Mardella LaymanHagler, Jamey Demchak, MD 01/30/19 1140

## 2019-01-30 NOTE — Discharge Instructions (Signed)

## 2019-01-30 NOTE — ED Triage Notes (Signed)
States partner tested positive for chlamydia.  Patient unclear if he has had discharge

## 2019-01-31 LAB — URINE CYTOLOGY ANCILLARY ONLY
CHLAMYDIA, DNA PROBE: POSITIVE — AB
Neisseria Gonorrhea: NEGATIVE
Trichomonas: NEGATIVE

## 2019-02-01 ENCOUNTER — Telehealth (HOSPITAL_COMMUNITY): Payer: Self-pay | Admitting: Emergency Medicine

## 2019-02-01 NOTE — Telephone Encounter (Signed)
Chlamydia is positive.  This was treated at the urgent care visit with po zithromax 1g.  Pt needs education to please refrain from sexual intercourse for 7 days to give the medicine time to work.  Sexual partners need to be notified and tested/treated.  Condoms may reduce risk of reinfection.  Recheck or followup with PCP for further evaluation if symptoms are not improving.  [facility] notified.  Attempted to reach patient. No answer at this time. Voicemail left.    

## 2019-02-05 ENCOUNTER — Telehealth (HOSPITAL_COMMUNITY): Payer: Self-pay | Admitting: Emergency Medicine

## 2019-02-05 NOTE — Telephone Encounter (Signed)
Attempted to reach patient x2. No answer at this time. Voicemail left.    

## 2019-02-06 ENCOUNTER — Telehealth (HOSPITAL_COMMUNITY): Payer: Self-pay | Admitting: Emergency Medicine

## 2019-02-06 NOTE — Telephone Encounter (Signed)
Patient contacted and made aware of all results, all questions answered.   

## 2019-03-08 ENCOUNTER — Encounter (HOSPITAL_COMMUNITY): Payer: Self-pay | Admitting: *Deleted

## 2019-03-08 ENCOUNTER — Emergency Department (HOSPITAL_COMMUNITY): Payer: Self-pay

## 2019-03-08 ENCOUNTER — Emergency Department (HOSPITAL_COMMUNITY)
Admission: EM | Admit: 2019-03-08 | Discharge: 2019-03-09 | Disposition: A | Discharge status: Home / Self Care | Payer: Self-pay | Attending: Emergency Medicine | Admitting: Emergency Medicine

## 2019-03-08 ENCOUNTER — Other Ambulatory Visit: Payer: Self-pay

## 2019-03-08 DIAGNOSIS — Z720 Tobacco use: Secondary | ICD-10-CM | POA: Insufficient documentation

## 2019-03-08 DIAGNOSIS — R0789 Other chest pain: Secondary | ICD-10-CM | POA: Insufficient documentation

## 2019-03-08 LAB — CBC
HCT: 50.2 % (ref 39.0–52.0)
Hemoglobin: 16 g/dL (ref 13.0–17.0)
MCH: 28.2 pg (ref 26.0–34.0)
MCHC: 31.9 g/dL (ref 30.0–36.0)
MCV: 88.5 fL (ref 80.0–100.0)
Platelets: 245 10*3/uL (ref 150–400)
RBC: 5.67 MIL/uL (ref 4.22–5.81)
RDW: 12.8 % (ref 11.5–15.5)
WBC: 3.4 10*3/uL — AB (ref 4.0–10.5)
nRBC: 0 % (ref 0.0–0.2)

## 2019-03-08 LAB — POCT I-STAT TROPONIN I: TROPONIN I, POC: 0 ng/mL (ref 0.00–0.08)

## 2019-03-08 LAB — BASIC METABOLIC PANEL
Anion gap: 10 (ref 5–15)
BUN: 17 mg/dL (ref 6–20)
CO2: 26 mmol/L (ref 22–32)
Calcium: 9.6 mg/dL (ref 8.9–10.3)
Chloride: 101 mmol/L (ref 98–111)
Creatinine, Ser: 0.92 mg/dL (ref 0.61–1.24)
GFR calc Af Amer: >60 mL/min (ref >60)
GFR calc non Af Amer: >60 mL/min (ref >60)
Glucose, Bld: 94 mg/dL (ref 70–99)
Potassium: 3.8 mmol/L (ref 3.5–5.1)
SODIUM: 137 mmol/L (ref 135–145)

## 2019-03-08 NOTE — ED Notes (Signed)
Pt now stating "after I got sick, my girlfriend got sick.  My friend's son was sick here and saw them right before I went to [place]."

## 2019-03-08 NOTE — ED Triage Notes (Signed)
Pt c/o flu-like symptoms x 3 days, has had vomiting, a weak stomach.  Approximately 4 hours ago I started having chest pain."  Pt also c/o right upper back pain and chest pain is mid-sternal.  Describes pain sharp radiating from front to back.  Pt now stating "when I inhale my chest hurts a little more.  I just got back from [place] and got back last Thursday."

## 2019-03-08 NOTE — Discharge Instructions (Addendum)

## 2019-03-08 NOTE — ED Provider Notes (Signed)
[facility]-EMERGENCY DEPT Provider Note   CSN: [id number] Arrival date & time: 03/08/19  1948    History   Chief Complaint Chief Complaint  Patient presents with  . flu-like symptoms  . Chest Pain    mid-chest    HPI [name] is a 33 y.o. male presents emergency department chief complaint of chest pain.  Patient states that about 4 hours prior to arrival he had sudden onset of sharp retrosternal chest pain radiating to his back which she described as severe, lasting for about 30 minutes.  He states that he had associated nausea and diaphoresis.  He had some epigastric discomfort as well.  Patient was not worse with exertion or rest.  Since that time he has been present but is much less painful.  Patient states that his father died of a myocardial infarction in his 13s.  Other than that he has risk factors of male sex and obesity.  He denies a history of hypertension, hyperlipidemia, diabetes.  He does not smoke.  States that he did have flulike symptoms earlier this week but those have resolved and he is feeling much better.  He denies unilateral leg swelling, pleuritic chest pain, hemoptysis and has no personal history of DVT or PE.  HPI: A 33 year old patient with a history of obesity presents for evaluation of chest pain. Initial onset of pain was approximately 3-6 hours ago. The patient's chest pain is sharp and is not worse with exertion. The patient's chest pain is middle- or left-sided, is not well-localized, is not described as heaviness/pressure/tightness and does not radiate to the arms/jaw/neck. The patient does not complain of nausea and denies diaphoresis. The patient has a family history of coronary artery disease in a first-degree relative with onset less than age 48. The patient has no history of stroke, has no history of peripheral artery disease, has not smoked in the past 90 days, denies any history of treated diabetes, is not hypertensive and has  no history of hypercholesterolemia.   HPI  History reviewed. No pertinent past medical history.  There are no active problems to display for this patient.   Past Surgical History:  Procedure Laterality Date  . ADENOIDECTOMY    . EYE SURGERY          Home Medications    Prior to Admission medications   Not on File    Family History Family History  Problem Relation Age of Onset  . Diabetes Mother   . Blindness Maternal Aunt     Social History Social History   Tobacco Use  . Smoking status: Light Tobacco Smoker  . Smokeless tobacco: Never Used  . Tobacco comment: Pt stated "I go to a hookah lounge every once and a while."  Substance Use Topics  . Alcohol use: No  . Drug use: No     Allergies   Patient has no known allergies.   Review of Systems Review of Systems  Ten systems reviewed and are negative for acute change, except as noted in the HPI.   Physical Exam Updated Vital Signs BP 120/88   Pulse 84   Temp 98.4 F (36.9 C) (Oral)   Resp 19   Ht 5\' 8"  (1.727 m)   Wt 106.6 kg   SpO2 98%   BMI 35.73 kg/m   Physical Exam Vitals signs and nursing note reviewed.  Constitutional:      General: He is not in acute distress.    Appearance: He is  well-developed. He is not diaphoretic.  HENT:     Head: Normocephalic and atraumatic.  Eyes:     General: No scleral icterus.    Conjunctiva/sclera: Conjunctivae normal.  Neck:     Musculoskeletal: Normal range of motion and neck supple.  Cardiovascular:     Rate and Rhythm: Normal rate and regular rhythm.     Heart sounds: Normal heart sounds.  Pulmonary:     Effort: Pulmonary effort is normal. No respiratory distress.     Breath sounds: Normal breath sounds.  Abdominal:     Palpations: Abdomen is soft.     Tenderness: There is no abdominal tenderness.  Skin:    General: Skin is warm and dry.  Neurological:     Mental Status: He is alert.  Psychiatric:        Behavior: Behavior normal.       ED Treatments / Results  Labs (all labs ordered are listed, but only abnormal results are displayed) Labs Reviewed  CBC - Abnormal; Notable for the following components:      Result Value   WBC 3.4 (*)    All other components within normal limits  BASIC METABOLIC PANEL  I-STAT TROPONIN, ED  POCT I-STAT TROPONIN I    EKG EKG Interpretation  Date/Time:  Thursday March 08 2019 20:00:05 EDT Ventricular Rate:  88 PR Interval:    QRS Duration: 89 QT Interval:  353 QTC Calculation: 428 R Axis:   42 Text Interpretation:  Sinus rhythm Borderline T wave abnormalities No significant change since last tracing Confirmed by Tilden Fossaees, Elizabeth [phone]([id number]) on 03/08/2019 10:58:29 PM Also confirmed by Tilden Fossaees, Elizabeth [phone]), editor Barbette Hairassel, Kerry [phone]([id number])  on 03/09/2019 7:03:57 AM   Radiology No results found.  Procedures Procedures (including critical care time)  Medications Ordered in ED Medications - No data to display   Initial Impression / Assessment and Plan / ED Course  I have reviewed the triage vital signs and the nursing notes.  Pertinent labs & imaging results that were available during my care of the patient were reviewed by me and considered in my medical decision making (see chart for details).     HEART Score: 2  BJ:YNWGNCC:Chest pain VS: BP 120/88   Pulse 84   Temp 98.4 F (36.9 C) (Oral)   Resp 19   Ht 5\' 8"  (1.727 m)   Wt 106.6 kg   SpO2 98%   BMI 35.73 kg/m  FA:OZHYQMVHX:History is gathered by patient  and nursing triage notes. DDX: The emergent differential diagnosis of chest pain includes: Acute coronary syndrome, pericarditis, aortic dissection, pulmonary embolism, tension pneumothorax, pneumonia, and esophageal rupture. Labs: I reviewed the labs which show negative troponin, WBC minimally low, Normal BMP Imaging: I personally reviewed the images (2V CXR) which show(s) Normal heart size, no edema or consolidation, no widened mediastinum EKG: Mild T wave abnormalities. Normal Rate  and rhythm. No signs of acute ischemia MDM: Patient here with cp. Heart score of 2, meets PERC. I No evidence of PTX or CAP. I doubt any other emergent causes of the patient's complaint.  Patient disposition: Home Patient condition:  Good. The patient appears reasonably screened and/or stabilized for discharge and I doubt any other medical condition or other [facility] requiring further screening, evaluation, or treatment in the ED at this time prior to discharge. I have discussed lab and/or imaging findings with the patient and answered all questions/concerns to the best of my ability. I have discussed return precautions and OP follow up.  Final Clinical Impressions(s) / ED Diagnoses   Final diagnoses:  Atypical chest pain    ED Discharge Orders    None       Arthor Captain, PA-C 03/12/19 0272    Tilden Fossa, MD 03/12/19 1221

## 2019-10-24 ENCOUNTER — Encounter (HOSPITAL_COMMUNITY): Payer: Self-pay | Admitting: Emergency Medicine

## 2019-10-24 ENCOUNTER — Ambulatory Visit (HOSPITAL_COMMUNITY)
Admission: EM | Admit: 2019-10-24 | Discharge: 2019-10-24 | Disposition: A | Discharge status: Home / Self Care | Payer: Self-pay | Attending: Family Medicine | Admitting: Family Medicine

## 2019-10-24 ENCOUNTER — Other Ambulatory Visit: Payer: Self-pay

## 2019-10-24 DIAGNOSIS — R369 Urethral discharge, unspecified: Secondary | ICD-10-CM | POA: Insufficient documentation

## 2019-10-24 MED ORDER — AZITHROMYCIN 250 MG PO TABS
1000.0000 mg | ORAL_TABLET | Freq: Once | ORAL | Status: AC
  Administered 2019-10-24: 1000 mg via ORAL

## 2019-10-24 MED ORDER — CEFTRIAXONE SODIUM 250 MG IJ SOLR
250.0000 mg | Freq: Once | INTRAMUSCULAR | Status: AC
  Administered 2019-10-24: 15:00:00 250 mg via INTRAMUSCULAR

## 2019-10-24 MED ORDER — CEFTRIAXONE SODIUM 250 MG IJ SOLR
INTRAMUSCULAR | Status: AC
  Filled 2019-10-24: qty 250

## 2019-10-24 MED ORDER — AZITHROMYCIN 250 MG PO TABS
ORAL_TABLET | ORAL | Status: AC
  Filled 2019-10-24: qty 4

## 2019-10-24 NOTE — ED Provider Notes (Signed)
[name]   [id number] 10/24/19 Arrival Time: 1250  ASSESSMENT & PLAN:  1. Penile discharge     Declines HIV/RPR testing.   Discharge Instructions     You have been given the following medications today for treatment of suspected gonorrhea and/or chlamydia:  cefTRIAXone (ROCEPHIN) injection 250 mg azithromycin (ZITHROMAX) tablet 1,000 mg  Even though we have treated you today, we have sent testing for sexually transmitted infections. We will notify you of any positive results once they are received. If required, we will prescribe any medications you might need.  Please refrain from all sexual activity for at least the next seven days.     Pending: Labs Reviewed  CYTOLOGY, (ORAL, ANAL, URETHRAL) ANCILLARY ONLY    Will notify of any positive results. Instructed to refrain from sexual activity for at least seven days.  Reviewed expectations re: course of current medical issues. Questions answered. Outlined signs and symptoms indicating need for more acute intervention. Patient verbalized understanding. After Visit Summary given.   SUBJECTIVE:  [name] is a 33 y.o. male who presents with complaint of penile discharge. Onset abrupt. First noticed 2 d ago. Describes discharge as thick and white/yellow. Denies: urinary frequency, hematuria, chills and sweats. Afebrile. No abdominal or pelvic pain. No n/v. No rashes or lesions. Reports that he is sexually active with single male partner. OTC treatment: none. History of STI: Treated for chlamydia several months ago.  ROS: As per HPI. All other systems negative.   OBJECTIVE:  Vitals:   10/24/19 1406  BP: 134/85  Pulse: 80  Resp: 14  Temp: 98.3 F (36.8 C)  TempSrc: Oral  SpO2: 99%     General appearance: alert, cooperative, appears stated age and no distress Throat: lips, mucosa, and tongue normal; teeth and gums normal CV: RRR Lungs: CTAB Back: no CVA tenderness; FROM at waist Abdomen:  soft, non-tender GU: normal appearing genitalia Skin: warm and dry Psychological: alert and cooperative; normal mood and affect.    Labs Reviewed  CYTOLOGY, (ORAL, ANAL, URETHRAL) ANCILLARY ONLY    No Known Allergies  PMH: As in HPI.  Family History  Problem Relation Age of Onset  . Diabetes Mother   . Blindness Maternal Aunt    Social History   Socioeconomic History  . Marital status: Single    Spouse name: Not on file  . Number of children: Not on file  . Years of education: Not on file  . Highest education level: Not on file  Occupational History  . Not on file  Social Needs  . Financial resource strain: Not on file  . Food insecurity    Worry: Not on file    Inability: Not on file  . Transportation needs    Medical: Not on file    Non-medical: Not on file  Tobacco Use  . Smoking status: Light Tobacco Smoker  . Smokeless tobacco: Never Used  . Tobacco comment: Pt stated "I go to a hookah lounge every once and a while."  Substance and Sexual Activity  . Alcohol use: No  . Drug use: No  . Sexual activity: Not on file  Lifestyle  . Physical activity    Days per week: Not on file    Minutes per session: Not on file  . Stress: Not on file  Relationships  . Social Herbalist on phone: Not on file    Gets together: Not on file    Attends religious service: Not on file  Active member of club or organization: Not on file    Attends meetings of clubs or organizations: Not on file    Relationship status: Not on file  . Intimate partner violence    Fear of current or ex partner: Not on file    Emotionally abused: Not on file    Physically abused: Not on file    Forced sexual activity: Not on file  Other Topics Concern  . Not on file  Social History Narrative  . Not on file          Clinton Layman, MD 10/24/19 1544

## 2019-10-24 NOTE — ED Triage Notes (Signed)
Pt reports left lower flank pain that started two days ago. He denies any urinary issues.  Pt was also exposed to Chlamydia, it was reported to him two weeks ago.

## 2019-10-24 NOTE — Discharge Instructions (Signed)

## 2019-10-26 ENCOUNTER — Telehealth (HOSPITAL_COMMUNITY): Payer: Self-pay | Admitting: Emergency Medicine

## 2019-10-26 LAB — CYTOLOGY, (ORAL, ANAL, URETHRAL) ANCILLARY ONLY
Chlamydia: POSITIVE — AB
Neisseria Gonorrhea: NEGATIVE
Trichomonas: NEGATIVE

## 2019-10-26 NOTE — Telephone Encounter (Signed)
Chlamydia is positive.  This was treated at the urgent care visit with po zithromax 1g.  Pt needs education to please refrain from sexual intercourse for 7 days to give the medicine time to work.  Sexual partners need to be notified and tested/treated.  Condoms may reduce risk of reinfection.  Recheck or followup with PCP for further evaluation if symptoms are not improving.  [facility] notified.  Attempted to reach patient. No answer at this time. Call cannot be completed.

## 2019-10-29 ENCOUNTER — Telehealth: Payer: Self-pay | Admitting: Emergency Medicine

## 2019-10-29 NOTE — Telephone Encounter (Signed)
Attempted to reach patient x2. No answer at this time. Call cannot be completed at this time. Letter sent .

## 2020-08-13 ENCOUNTER — Other Ambulatory Visit: Payer: Self-pay

## 2020-08-13 ENCOUNTER — Ambulatory Visit (HOSPITAL_COMMUNITY)
Admission: EM | Admit: 2020-08-13 | Discharge: 2020-08-13 | Disposition: A | Discharge status: Home / Self Care | Payer: Self-pay | Attending: Urgent Care | Admitting: Urgent Care

## 2020-08-13 ENCOUNTER — Encounter (HOSPITAL_COMMUNITY): Payer: Self-pay

## 2020-08-13 DIAGNOSIS — Z7251 High risk heterosexual behavior: Secondary | ICD-10-CM | POA: Insufficient documentation

## 2020-08-13 DIAGNOSIS — B356 Tinea cruris: Secondary | ICD-10-CM | POA: Insufficient documentation

## 2020-08-13 DIAGNOSIS — Z113 Encounter for screening for infections with a predominantly sexual mode of transmission: Secondary | ICD-10-CM | POA: Insufficient documentation

## 2020-08-13 LAB — HIV ANTIBODY (ROUTINE TESTING W REFLEX): HIV Screen 4th Generation wRfx: NONREACTIVE

## 2020-08-13 MED ORDER — KETOCONAZOLE 2 % EX CREA: 1.0000 "application " | TOPICAL_CREAM | Freq: Every day | CUTANEOUS | 0 refills | Status: AC

## 2020-08-13 NOTE — Discharge Instructions (Addendum)
Please use the ketoconazole cream once daily for the next couple weeks.  We will let you know if you end up needing any treatment for sexually transmitted infections.

## 2020-08-13 NOTE — ED Triage Notes (Signed)
Pt requesting STD testing, denies symptoms 

## 2020-08-13 NOTE — ED Provider Notes (Signed)
  [facility]   MRN: [id number] DOB: [birth year]  Subjective:   [name] is a 34 y.o. male presenting for itching over the right groin, mild occasional uncomfortable sensation when he pees.  Patient is sexually active, wants to get STI testing including HIV and syphilis.  Denies fever, nausea, vomiting, pelvic pain, penile discharge, penile or testicular pain or swelling.  No current facility-administered medications for this encounter. No current outpatient medications on file.   No Known Allergies  History reviewed. No pertinent past medical history.   Past Surgical History:  Procedure Laterality Date  . ADENOIDECTOMY    . EYE SURGERY      Family History  Problem Relation Age of Onset  . Diabetes Mother   . Blindness Maternal Aunt     Social History   Tobacco Use  . Smoking status: Light Tobacco Smoker  . Smokeless tobacco: Never Used  . Tobacco comment: Pt stated "I go to a hookah lounge every once and a while."  Substance Use Topics  . Alcohol use: No  . Drug use: No    ROS   Objective:   Vitals: BP (!) 120/93   Pulse 89   Temp (!) 97.5 F (36.4 C)   Resp 18   SpO2 99%   Physical Exam Constitutional:      General: He is not in acute distress.    Appearance: Normal appearance. He is well-developed and normal weight. He is not ill-appearing, toxic-appearing or diaphoretic.  HENT:     Head: Normocephalic and atraumatic.     Right Ear: External ear normal.     Left Ear: External ear normal.     Nose: Nose normal.     Mouth/Throat:     Pharynx: Oropharynx is clear.  Eyes:     General: No scleral icterus.       Right eye: No discharge.        Left eye: No discharge.     Extraocular Movements: Extraocular movements intact.     Pupils: Pupils are equal, round, and reactive to light.  Cardiovascular:     Rate and Rhythm: Normal rate.  Pulmonary:     Effort: Pulmonary effort is normal.  Genitourinary:    Pubic Area: Rash present.      Penis: Uncircumcised. No phimosis, paraphimosis, hypospadias, erythema, tenderness, discharge, swelling or lesions.     Musculoskeletal:     Cervical back: Normal range of motion.  Skin:    General: Skin is warm and dry.  Neurological:     Mental Status: He is alert and oriented to person, place, and time.  Psychiatric:        Mood and Affect: Mood normal.        Behavior: Behavior normal.        Thought Content: Thought content normal.        Judgment: Judgment normal.      Assessment and Plan :   PDMP not reviewed this encounter.  1. Jock itch   2. Screen for STD (sexually transmitted disease)   3. Unprotected sex     Start ketoconazole if your child gets.  STI testing pending. Counseled patient on potential for adverse effects with medications prescribed/recommended today, ER and return-to-clinic precautions discussed, patient verbalized understanding.    Wallis Bamberg, PA-C 08/13/20 1434

## 2020-08-14 LAB — CYTOLOGY, (ORAL, ANAL, URETHRAL) ANCILLARY ONLY
Chlamydia: NEGATIVE
Comment: NEGATIVE
Comment: NEGATIVE
Comment: NORMAL
Neisseria Gonorrhea: NEGATIVE
Trichomonas: NEGATIVE

## 2020-08-14 LAB — RPR: RPR Ser Ql: NONREACTIVE

## 2021-02-09 ENCOUNTER — Ambulatory Visit (HOSPITAL_COMMUNITY)
Admission: EM | Admit: 2021-02-09 | Discharge: 2021-02-09 | Disposition: A | Discharge status: Home / Self Care | Payer: Self-pay | Attending: Family Medicine | Admitting: Family Medicine

## 2021-02-09 ENCOUNTER — Other Ambulatory Visit: Payer: Self-pay

## 2021-02-09 ENCOUNTER — Encounter (HOSPITAL_COMMUNITY): Payer: Self-pay

## 2021-02-09 DIAGNOSIS — B356 Tinea cruris: Secondary | ICD-10-CM

## 2021-02-09 MED ORDER — KETOCONAZOLE 2 % EX CREA: 1.0000 "application " | TOPICAL_CREAM | Freq: Two times a day (BID) | CUTANEOUS | 1 refills | Status: DC | PRN

## 2021-02-09 NOTE — ED Triage Notes (Addendum)
Pt reports itchy rash is legs and groin area x 1 week. States he was seen for Jock itch and prescribed medications and was not able to pick it up.

## 2021-02-09 NOTE — ED Provider Notes (Signed)
[facility]    CSN: [id number] Arrival date & time: 02/09/21  1650      History   Chief Complaint Chief Complaint  Patient presents with  . Rash    HPI [name] is a 35 y.o. male.   Patient presenting today for 2 week history of rash b/l groin folds that is very itchy. States he's had the itching in this area for about 6 months now, came here and got ketoconazole but never picked the medicine up. The rash appeared just 2 weeks ago and itching has worsened. Denies trying anything OTC for sxs or changing hygiene products or habits. Denies chance of STI.      History reviewed. No pertinent past medical history.  There are no problems to display for this patient.   Past Surgical History:  Procedure Laterality Date  . ADENOIDECTOMY    . EYE SURGERY         Home Medications    Prior to Admission medications   Medication Sig Start Date End Date Taking? Authorizing Provider  ketoconazole (NIZORAL) 2 % cream Apply 1 application topically 2 (two) times daily as needed for irritation. 02/09/21  Yes Particia Nearing, PA-C    Family History Family History  Problem Relation Age of Onset  . Diabetes Mother   . Blindness Maternal Aunt     Social History Social History   Tobacco Use  . Smoking status: Light Tobacco Smoker  . Smokeless tobacco: Never Used  . Tobacco comment: Pt stated "I go to a hookah lounge every once and a while."  Substance Use Topics  . Alcohol use: No  . Drug use: No     Allergies   Patient has no known allergies.   Review of Systems Review of Systems PER HPI   Physical Exam Triage Vital Signs ED Triage Vitals  Enc Vitals Group     BP 02/09/21 1817 127/84     Pulse Rate 02/09/21 1817 96     Resp 02/09/21 1817 18     Temp 02/09/21 1817 97.9 F (36.6 C)     Temp Source 02/09/21 1817 Oral     SpO2 02/09/21 1817 99 %     Weight --      Height --      Head Circumference --      Peak Flow --      Pain  Score 02/09/21 1816 0     Pain Loc --      Pain Edu? --      Excl. in GC? --    No data found.  Updated Vital Signs BP 127/84 (BP Location: Right Arm)   Pulse 96   Temp 97.9 F (36.6 C) (Oral)   Resp 18   SpO2 99%   Visual Acuity Right Eye Distance:   Left Eye Distance:   Bilateral Distance:    Right Eye Near:   Left Eye Near:    Bilateral Near:     Physical Exam Vitals and nursing note reviewed.  Constitutional:      Appearance: Normal appearance.  HENT:     Head: Atraumatic.  Eyes:     Extraocular Movements: Extraocular movements intact.     Conjunctiva/sclera: Conjunctivae normal.  Cardiovascular:     Rate and Rhythm: Normal rate and regular rhythm.  Pulmonary:     Effort: Pulmonary effort is normal.     Breath sounds: Normal breath sounds.  Genitourinary:    Comments: Declines GU exam Musculoskeletal:  General: Normal range of motion.     Cervical back: Normal range of motion and neck supple.  Skin:    General: Skin is warm and dry.     Findings: Rash (circular dry scaly patches b/l groin folds ) present.  Neurological:     General: No focal deficit present.     Mental Status: He is oriented to person, place, and time.  Psychiatric:        Mood and Affect: Mood normal.        Thought Content: Thought content normal.        Judgment: Judgment normal.      UC Treatments / Results  Labs (all labs ordered are listed, but only abnormal results are displayed) Labs Reviewed - No data to display  EKG   Radiology No results found.  Procedures Procedures (including critical care time)  Medications Ordered in UC Medications - No data to display  Initial Impression / Assessment and Plan / UC Course  I have reviewed the triage vital signs and the nursing notes.  Pertinent labs & imaging results that were available during my care of the patient were reviewed by me and considered in my medical decision making (see chart for details).      Consistent with fungal infection, treat with ketoconazole cream, OTC antifungal powders, keeping clean and dry in this area. Return for worsening or not resolving sxs.   Final Clinical Impressions(s) / UC Diagnoses   Final diagnoses:  Tinea cruris   Discharge Instructions   None    ED Prescriptions    Medication Sig Dispense Auth. Provider   ketoconazole (NIZORAL) 2 % cream Apply 1 application topically 2 (two) times daily as needed for irritation. 100 g Particia Nearing, [place]     PDMP not reviewed this encounter.   Particia Nearing, [place] 02/09/21 1947

## 2021-10-19 ENCOUNTER — Emergency Department (HOSPITAL_COMMUNITY)
Admission: EM | Admit: 2021-10-19 | Discharge: 2021-10-19 | Disposition: A | Discharge status: Home / Self Care | Payer: Self-pay | Attending: Emergency Medicine | Admitting: Emergency Medicine

## 2021-10-19 ENCOUNTER — Emergency Department (HOSPITAL_COMMUNITY): Payer: Self-pay

## 2021-10-19 ENCOUNTER — Encounter (HOSPITAL_COMMUNITY): Payer: Self-pay | Admitting: Emergency Medicine

## 2021-10-19 ENCOUNTER — Other Ambulatory Visit: Payer: Self-pay

## 2021-10-19 DIAGNOSIS — Y9389 Activity, other specified: Secondary | ICD-10-CM | POA: Insufficient documentation

## 2021-10-19 DIAGNOSIS — Y249XXA Unspecified firearm discharge, undetermined intent, initial encounter: Secondary | ICD-10-CM

## 2021-10-19 DIAGNOSIS — S71102A Unspecified open wound, left thigh, initial encounter: Secondary | ICD-10-CM | POA: Insufficient documentation

## 2021-10-19 DIAGNOSIS — F17200 Nicotine dependence, unspecified, uncomplicated: Secondary | ICD-10-CM | POA: Insufficient documentation

## 2021-10-19 DIAGNOSIS — S71112A Laceration without foreign body, left thigh, initial encounter: Secondary | ICD-10-CM | POA: Insufficient documentation

## 2021-10-19 DIAGNOSIS — W3400XA Accidental discharge from unspecified firearms or gun, initial encounter: Secondary | ICD-10-CM | POA: Insufficient documentation

## 2021-10-19 MED ORDER — CEFAZOLIN SODIUM-DEXTROSE 1-4 GM/50ML-% IV SOLN
1.0000 g | Freq: Once | INTRAVENOUS | Status: AC
  Administered 2021-10-19: 1 g via INTRAVENOUS
  Filled 2021-10-19: qty 50

## 2021-10-19 MED ORDER — LIDOCAINE-EPINEPHRINE (PF) 2 %-1:200000 IJ SOLN
20.0000 mL | Freq: Once | INTRAMUSCULAR | Status: AC
  Administered 2021-10-19: 20 mL
  Filled 2021-10-19: qty 20

## 2021-10-19 MED ORDER — OXYCODONE-ACETAMINOPHEN 5-325 MG PO TABS
1.0000 | ORAL_TABLET | Freq: Once | ORAL | Status: AC
  Administered 2021-10-19: 1 via ORAL
  Filled 2021-10-19: qty 1

## 2021-10-19 MED ORDER — IBUPROFEN 800 MG PO TABS: 800.0000 mg | ORAL_TABLET | Freq: Four times a day (QID) | ORAL | 0 refills | Status: AC | PRN

## 2021-10-19 MED ORDER — IBUPROFEN 800 MG PO TABS
800.0000 mg | ORAL_TABLET | Freq: Once | ORAL | Status: AC
  Administered 2021-10-19: 800 mg via ORAL
  Filled 2021-10-19: qty 1

## 2021-10-19 MED ORDER — DOXYCYCLINE HYCLATE 100 MG PO CAPS: 100.0000 mg | ORAL_CAPSULE | Freq: Two times a day (BID) | ORAL | 0 refills | Status: AC

## 2021-10-19 NOTE — Progress Notes (Signed)
Orthopedic Tech Progress Note Patient Details:  [name] 1986-11-10 [id number]  Patient ID: [name], male   DOB: January 04, 1986, 35 y.o.   MRN: [id number] I attended trauma page [facility] Post 10/19/2021, 6:44 AM
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
## 2021-10-31 ENCOUNTER — Other Ambulatory Visit: Payer: Self-pay

## 2021-10-31 ENCOUNTER — Ambulatory Visit (HOSPITAL_COMMUNITY)
Admission: EM | Admit: 2021-10-31 | Discharge: 2021-10-31 | Disposition: A | Discharge status: Home / Self Care | Payer: Self-pay | Attending: Emergency Medicine | Admitting: Emergency Medicine

## 2021-10-31 DIAGNOSIS — T148XXA Other injury of unspecified body region, initial encounter: Secondary | ICD-10-CM

## 2021-10-31 DIAGNOSIS — L089 Local infection of the skin and subcutaneous tissue, unspecified: Secondary | ICD-10-CM

## 2021-10-31 MED ORDER — CEFTRIAXONE SODIUM 1 G IJ SOLR
INTRAMUSCULAR | Status: AC
  Filled 2021-10-31: qty 20

## 2021-10-31 MED ORDER — LIDOCAINE HCL (PF) 2 % IJ SOLN
INTRAMUSCULAR | Status: AC
  Filled 2021-10-31: qty 5

## 2021-10-31 MED ORDER — CLINDAMYCIN HCL 150 MG PO CAPS: 450.0000 mg | ORAL_CAPSULE | Freq: Four times a day (QID) | ORAL | 0 refills | Status: AC

## 2021-10-31 MED ORDER — CEFTRIAXONE SODIUM 1 G IJ SOLR
2.0000 g | Freq: Once | INTRAMUSCULAR | Status: AC
  Administered 2021-10-31: 2 g via INTRAMUSCULAR

## 2021-10-31 NOTE — Discharge Instructions (Addendum)
You received Rocephin 2 g in your bottom today.  Please stop by your pharmacy and pick up your prescription for clindamycin, please take all of them as prescribed if you want to keep your leg and avoid sepsis, which can kill you.  Please also finish the rest of your doxycycline, please take it as prescribed.  I will see you in 2 days at the [facility] commons urgent care for follow-up.
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
## 2021-10-31 NOTE — ED Triage Notes (Signed)
Pt will receive 1000 grams of rocephin and three stir strip on the wound and due to come back on Monday or Tuesday at the [facility]. commons area.

## 2021-11-02 ENCOUNTER — Encounter: Payer: Self-pay | Admitting: Emergency Medicine

## 2021-11-02 ENCOUNTER — Ambulatory Visit
Admission: EM | Admit: 2021-11-02 | Discharge: 2021-11-02 | Disposition: A | Discharge status: Home / Self Care | Payer: Self-pay | Attending: Emergency Medicine | Admitting: Emergency Medicine

## 2021-11-02 ENCOUNTER — Other Ambulatory Visit: Payer: Self-pay

## 2021-11-02 DIAGNOSIS — L03116 Cellulitis of left lower limb: Secondary | ICD-10-CM

## 2021-11-02 DIAGNOSIS — W3400XA Accidental discharge from unspecified firearms or gun, initial encounter: Secondary | ICD-10-CM

## 2021-11-02 DIAGNOSIS — T8130XA Disruption of wound, unspecified, initial encounter: Secondary | ICD-10-CM

## 2021-11-02 DIAGNOSIS — T148XXD Other injury of unspecified body region, subsequent encounter: Secondary | ICD-10-CM

## 2021-11-02 MED ORDER — CEFTRIAXONE SODIUM 1 G IJ SOLR
2.0000 g | Freq: Once | INTRAMUSCULAR | Status: AC
  Administered 2021-11-02: 2 g via INTRAMUSCULAR

## 2021-11-02 NOTE — ED Triage Notes (Signed)
Pt presents for wound check.
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
## 2021-11-02 NOTE — Discharge Instructions (Addendum)
You received your second injection of ceftriaxone in the office today.  I would like you to come back in 2 days for a third.  In the meantime, please set a reminder in your phone for you to take clindamycin 3 tablets 4 times daily and doxycycline 1 tablet 2 times daily.    Set your alarm on your phone for 6 AM, noon, 6 PM and midnight, keep your antibiotic tablets in a Ziploc bag in your pocket, enough tablets for each day.    Take both clindamycin (3 tablets) and doxycycline (1 tablet) at 6 AM and 6 PM, take clindamycin (3 tablets) alone at noon and midnight.  Wound care: Every morning I want you to use the tweezers to pull half of an inch of the packing out of your wound, trim off the tip with the scissors.  Redress wound with the shiny Curad bandage and the sticky white square tape.  If you continue to have a lot of fluid draining out of the wound, you can use the gauze the material over the Curad bandage to help absorb some of the excess fluid.  Please go to the emergency room if the wound suddenly takes a turn for the worse, this may mean that you need IV antibiotics and you might also need surgery to clean out the wound to remove any infection that is just to aggressive to be addressed by the antibiotics you are taking right now.  Thank you for coming in today and I will see you in 2 days.

## 2021-11-04 ENCOUNTER — Ambulatory Visit
Admission: EM | Admit: 2021-11-04 | Discharge: 2021-11-04 | Disposition: A | Discharge status: Home / Self Care | Payer: Self-pay | Attending: Emergency Medicine | Admitting: Emergency Medicine

## 2021-11-04 ENCOUNTER — Other Ambulatory Visit: Payer: Self-pay

## 2021-11-04 DIAGNOSIS — L089 Local infection of the skin and subcutaneous tissue, unspecified: Secondary | ICD-10-CM

## 2021-11-04 DIAGNOSIS — T148XXA Other injury of unspecified body region, initial encounter: Secondary | ICD-10-CM

## 2021-11-04 MED ORDER — CEFTRIAXONE SODIUM 1 G IJ SOLR
2.0000 g | Freq: Once | INTRAMUSCULAR | Status: AC
  Administered 2021-11-04: 2 g via INTRAMUSCULAR

## 2021-11-04 NOTE — ED Triage Notes (Signed)
Pt presents here today to have wound to left leg re-evaluated.

## 2021-11-06 ENCOUNTER — Ambulatory Visit
Admission: EM | Admit: 2021-11-06 | Discharge: 2021-11-06 | Disposition: A | Discharge status: Home / Self Care | Payer: Self-pay | Attending: Emergency Medicine | Admitting: Emergency Medicine

## 2021-11-06 ENCOUNTER — Other Ambulatory Visit: Payer: Self-pay

## 2021-11-06 DIAGNOSIS — T148XXA Other injury of unspecified body region, initial encounter: Secondary | ICD-10-CM

## 2021-11-06 DIAGNOSIS — L089 Local infection of the skin and subcutaneous tissue, unspecified: Secondary | ICD-10-CM

## 2021-11-06 MED ORDER — CEFTRIAXONE SODIUM 1 G IJ SOLR
2.0000 g | Freq: Once | INTRAMUSCULAR | Status: AC
  Administered 2021-11-06: 2 g via INTRAMUSCULAR

## 2021-11-06 NOTE — ED Triage Notes (Signed)
Pt states he is here for a wound check to left leg.

## 2021-11-06 NOTE — Discharge Instructions (Signed)
Over the weekend if you have not seen any improvement, I I would encourage you to go to our [facility] location for evaluation of your wound.  Is my medical director recommended, you could also reach out to one of the 2 wound care center facilities that I have included with your discharge summary today, [facility] and [facility] to see if they can work you in.  Again, it is very important that you take all of your medications as prescribed.  Is also important that you attend to your wound daily as we discussed, pulling out 1/2 inch of the material that I packed into the wound, trimming it off and replacing with a fresh bandage after your daily shower as well as any other time that the bandaging becomes wet or soiled.

## 2021-11-10 ENCOUNTER — Encounter (HOSPITAL_BASED_OUTPATIENT_CLINIC_OR_DEPARTMENT_OTHER): Payer: Self-pay | Attending: Internal Medicine | Admitting: Internal Medicine

## 2021-11-10 ENCOUNTER — Other Ambulatory Visit: Payer: Self-pay

## 2021-11-10 DIAGNOSIS — L97822 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ed: Secondary | ICD-10-CM | POA: Insufficient documentation

## 2021-11-10 DIAGNOSIS — T8130XA Disruption of wound, unspecified, initial encounter: Secondary | ICD-10-CM | POA: Insufficient documentation

## 2021-11-10 DIAGNOSIS — W3400XA Accidental discharge from unspecified firearms or gun, initial encounter: Secondary | ICD-10-CM | POA: Insufficient documentation

## 2021-11-10 DIAGNOSIS — F17290 Nicotine dependence, other tobacco product, uncomplicated: Secondary | ICD-10-CM | POA: Insufficient documentation

## 2021-11-10 NOTE — Progress Notes (Signed)
LINN, GOETZE ([id number]) Visit Report for 11/10/2021 Allergy List Details Patient Name: Date of Service: [name] 11/10/2021 9:00 A M Medical Record Number: [id number] Patient Account Number: [id number] Date of Birth/Sex: Treating RN: 12-01-1986 (35 y.o. [name] Primary Care Provider: PA Haig Prophet, NO Other Clinician: Referring Provider: Treating Provider/Extender: Yaakov Guthrie in Treatment: 0 Allergies Active Allergies No Known Drug Allergies Allergy Notes Electronic Signature(s) Signed: 11/10/2021 5:20:05 PM By: Deon Pilling RN, BSN Entered By: Deon Pilling on 11/10/2021 09:11:24 -------------------------------------------------------------------------------- Arrival Information Details Patient Name: Date of Service: Clinton Picket D. 11/10/2021 9:00 A M Medical Record Number: [id number] Patient Account Number: [id number] Date of Birth/Sex: Treating RN: 1986/11/26 (35 y.o. [name] Primary Care Provider: PA Haig Prophet, NO Other Clinician: Referring Provider: Treating Provider/Extender: Yaakov Guthrie in Treatment: 0 Visit Information Patient Arrived: Ambulatory Arrival Time: 09:10 Accompanied By: self Transfer Assistance: None Patient Identification Verified: Yes Secondary Verification Process Completed: Yes Patient Requires Transmission-Based Precautions: No Patient Has Alerts: No Electronic Signature(s) Signed: 11/10/2021 5:20:05 PM By: Deon Pilling RN, BSN Entered By: Deon Pilling on 11/10/2021 09:11:00 -------------------------------------------------------------------------------- Clinic Level of Care Assessment Details Patient Name: Date of Service: Clinton Picket D. 11/10/2021 9:00 A M Medical Record Number: [id number] Patient Account Number: [id number] Date of Birth/Sex: Treating RN: [birth year] (35 y.o. [name] Primary Care Provider: PA Haig Prophet, NO Other Clinician: Referring Provider: Treating  Provider/Extender: Yaakov Guthrie in Treatment: 0 Clinic Level of Care Assessment Items TOOL 1 Quantity Score X- 1 0 Use when EandM and Procedure is performed on INITIAL visit ASSESSMENTS - Nursing Assessment / Reassessment X- 1 20 General Physical Exam (combine w/ comprehensive assessment (listed just below) when performed on new pt. evals) X- 1 25 Comprehensive Assessment (HX, ROS, Risk Assessments, Wounds Hx, etc.) ASSESSMENTS - Wound and Skin Assessment / Reassessment X- 1 10 Dermatologic / Skin Assessment (not related to wound area) ASSESSMENTS - Ostomy and/or Continence Assessment and Care [] - 0 Incontinence Assessment and Management [] - 0 Ostomy Care Assessment and Management (repouching, etc.) PROCESS - Coordination of Care [] - 0 Simple Patient / Family Education for ongoing care X- 1 20 Complex (extensive) Patient / Family Education for ongoing care X- 1 10 Staff obtains Programmer, systems, Records, T Results / Process Orders est [] - 0 Staff telephones HHA, Nursing Homes / Clarify orders / etc [] - 0 Routine Transfer to another Facility (non-emergent condition) [] - 0 Routine Hospital Admission (non-emergent condition) X- 1 15 New Admissions / Biomedical engineer / Ordering NPWT Apligraf, etc. , [] - 0 Emergency Hospital Admission (emergent condition) PROCESS - Special Needs [] - 0 Pediatric / Minor Patient Management [] - 0 Isolation Patient Management [] - 0 Hearing / Language / Visual special needs [] - 0 Assessment of Community assistance (transportation, D/C planning, etc.) [] - 0 Additional assistance / Altered mentation [] - 0 Support Surface(s) Assessment (bed, cushion, seat, etc.) INTERVENTIONS - Miscellaneous [] - 0 External ear exam [] - 0 Patient Transfer (multiple staff / Civil Service fast streamer / Similar devices) X- 1 5 Simple Staple / Suture removal (25 or less) [] - 0 Complex Staple / Suture removal (26 or more) [] - 0 Hypo/Hyperglycemic  Management (do not check if billed separately) [] - 0 Ankle / Brachial Index (ABI) - do not check if billed separately Has the patient been seen at the hospital within the last three years: Yes Total Score: 105  Level Of Care: New/Established - Level 3 Electronic Signature(s) Signed: 11/10/2021 5:20:05 PM By: Deon Pilling RN, BSN Entered By: Deon Pilling on 11/10/2021 10:12:10 -------------------------------------------------------------------------------- Encounter Discharge Information Details Patient Name: Date of Service: Clinton Picket D. 11/10/2021 9:00 A M Medical Record Number: [id number] Patient Account Number: [id number] Date of Birth/Sex: Treating RN: 1986/07/06 (35 y.o. [name] Primary Care Deklin Bieler: PA Haig Prophet, NO Other Clinician: Referring Shay Bartoli: Treating Avian Greenawalt/Extender: Yaakov Guthrie in Treatment: 0 Encounter Discharge Information Items Post Procedure Vitals Discharge Condition: Stable Temperature (F): 98.4 Ambulatory Status: Ambulatory Pulse (bpm): 84 Discharge Destination: Home Respiratory Rate (breaths/min): 16 Transportation: Private Auto Blood Pressure (mmHg): 112/77 Accompanied By: self Schedule Follow-up Appointment: Yes Clinical Summary of Care: Electronic Signature(s) Signed: 11/10/2021 5:20:05 PM By: Deon Pilling RN, BSN Entered By: Deon Pilling on 11/10/2021 10:16:23 -------------------------------------------------------------------------------- Lower Extremity Assessment Details Patient Name: Date of Service: Clinton Picket D. 11/10/2021 9:00 A M Medical Record Number: [id number] Patient Account Number: [id number] Date of Birth/Sex: Treating RN: Aug 11, 1986 (35 y.o. [name] Primary Care Jerlyn Pain: PA Darnelle Spangle Other Clinician: Referring Ludia Gartland: Treating Colin Norment/Extender: Yaakov Guthrie in Treatment: 0 Electronic Signature(s) Signed: 11/10/2021 5:20:05 PM By: Deon Pilling RN, BSN Entered  By: Deon Pilling on 11/10/2021 09:28:06 -------------------------------------------------------------------------------- Multi Wound Chart Details Patient Name: Date of Service: Clinton Picket D. 11/10/2021 9:00 A M Medical Record Number: [id number] Patient Account Number: [id number] Date of Birth/Sex: Treating RN: 08/17/1986 (35 y.o. [name] Primary Care Dayson Aboud: PA TIENT, NO Other Clinician: Referring Mennie Spiller: Treating Heylee Tant/Extender: Yaakov Guthrie in Treatment: 0 Vital Signs Height(in): 68 Pulse(bpm): 98 Weight(lbs): 250 Blood Pressure(mmHg): 112/77 Body Mass Index(BMI): 38 Temperature(F): 98.4 Respiratory Rate(breaths/min): 16 Photos: [N/A:N/A] Left, Medial Upper Leg Left, Anterior Upper Leg N/A Wound Location: Trauma Trauma N/A Wounding Event: Abrasion Abrasion N/A Primary Etiology: 10/19/2021 10/19/2021 N/A Date Acquired: 0 0 N/A Weeks of Treatment: Open Open N/A Wound Status: 1.6x3.5x1.1 0.5x5.2x0.1 N/A Measurements L x W x D (cm) 4.398 2.042 N/A A (cm) : rea 4.838 0.204 N/A Volume (cm) : 0.00% 0.00% N/A % Reduction in A rea: 0.00% 0.00% N/A % Reduction in Volume: 3 Position 1 (o'clock): 6.5 Maximum Distance 1 (cm): 4 Starting Position 1 (o'clock): 6 Ending Position 1 (o'clock): 1.8 Maximum Distance 1 (cm): Yes No N/A Tunneling: Yes No N/A Undermining: Full Thickness Without Exposed Unclassifiable N/A Classification: Support Structures Large None Present N/A Exudate A mount: Purulent N/A N/A Exudate Type: yellow, brown, green N/A N/A Exudate Color: Distinct, outline attached Distinct, outline attached N/A Wound Margin: Large (67-100%) None Present (0%) N/A Granulation A mount: Red, Pink N/A N/A Granulation Quality: Small (1-33%) Large (67-100%) N/A Necrotic A mount: Adherent Slough Eschar N/A Necrotic Tissue: Fat Layer (Subcutaneous Tissue): Yes Fascia: No N/A Exposed Structures: Fascia: No Fat  Layer (Subcutaneous Tissue): No Tendon: No Tendon: No Muscle: No Muscle: No Joint: No Joint: No Bone: No Bone: No None None N/A Epithelialization: Debridement - Excisional Debridement - Excisional N/A Debridement: Pre-procedure Verification/Time Out 09:50 09:50 N/A Taken: Lidocaine 4% Topical Solution Lidocaine 4% Topical Solution N/A Pain Control: Necrotic/Eschar, Subcutaneous, Necrotic/Eschar, Subcutaneous, N/A Tissue Debrided: [employer] Skin/Subcutaneous Tissue Skin/Subcutaneous Tissue N/A Level: 5.6 2.6 N/A Debridement A (sq cm): rea Curette, Forceps, Scissors Curette, Forceps, Scissors N/A Instrument: Minimum Minimum N/A Bleeding: Pressure Pressure N/A Hemostasis Achieved: 2 2 N/A Procedural Pain: 3 3 N/A Post Procedural Pain: Debridement Treatment Response: Procedure was tolerated well Procedure was tolerated well N/A Post  Debridement Measurements L x 1.6x3.5x1.1 0.5x5.2x0.1 N/A W x D (cm) 4.838 0.204 N/A Post Debridement Volume: (cm) N/A x4 sutures noted. N/A Assessment Notes: Debridement Debridement N/A Procedures Performed: Treatment Notes Wound #1 (Upper Leg) Wound Laterality: Left, Medial Cleanser Soap and Water Discharge Instruction: May shower and wash wound with dial antibacterial soap and water prior to dressing change. Peri-Wound Care Topical Primary Dressing Santyl Ointment Discharge Instruction: Apply directly to wound bed. Iodoform packing strip 1/4 (in) Discharge Instruction: Lightly pack into tunnel and undermining. Secondary Dressing ABD Pad, 8x10 Discharge Instruction: Apply over primary dressing as directed. Secured With 12M Medipore H Soft Cloth Surgical T ape, 4 x 10 (in/yd) Discharge Instruction: Secure with tape as directed. Compression Wrap Compression Stockings Add-Ons Wound #2 (Upper Leg) Wound Laterality: Left, Anterior Cleanser Soap and Water Discharge Instruction: May shower and wash wound with dial  antibacterial soap and water prior to dressing change. Peri-Wound Care Skin Prep Discharge Instruction: Use skin prep as directed Topical Primary Dressing Santyl Ointment Discharge Instruction: Apply nickel thick amount to wound bed as instructed Secondary Dressing ABD Pad, 8x10 Discharge Instruction: Apply over primary dressing as directed. Secured With 12M Medipore H Soft Cloth Surgical T ape, 4 x 10 (in/yd) Discharge Instruction: Secure with tape as directed. Compression Wrap Compression Stockings Add-Ons Electronic Signature(s) Signed: 11/10/2021 10:47:50 AM By: [name] DO Signed: 11/10/2021 5:20:05 PM By: Deon Pilling RN, BSN Entered By: [name] on 11/10/2021 10:36:46 -------------------------------------------------------------------------------- Multi-Disciplinary Care Plan Details Patient Name: Date of Service: Clinton Picket D. 11/10/2021 9:00 A M Medical Record Number: [id number] Patient Account Number: [id number] Date of Birth/Sex: Treating RN: Jan 26, 1986 (35 y.o. [name] Primary Care Provider: PA Haig Prophet, NO Other Clinician: Referring Provider: Treating Provider/Extender: Yaakov Guthrie in Treatment: 0 Active Inactive Orientation to the Wound Care Program Nursing Diagnoses: Knowledge deficit related to the wound healing center program Goals: Patient/caregiver will verbalize understanding of the [facility] Date Initiated: 11/10/2021 Target Resolution Date: 11/27/2021 Goal Status: Active Interventions: Provide education on orientation to the wound center Notes: Pain, Acute or Chronic Nursing Diagnoses: Pain, acute or chronic: actual or potential Potential alteration in comfort, pain Goals: Patient will verbalize adequate pain control and receive pain control interventions during procedures as needed Date Initiated: 11/10/2021 Target Resolution Date: 11/26/2021 Goal Status: Active Patient/caregiver  will verbalize comfort level met Date Initiated: 11/10/2021 Target Resolution Date: 11/26/2021 Goal Status: Active Interventions: Encourage patient to take pain medications as prescribed Provide education on pain management Reposition patient for comfort Treatment Activities: Administer pain control measures as ordered : 11/10/2021 Notes: Wound/Skin Impairment Nursing Diagnoses: Knowledge deficit related to ulceration/compromised skin integrity Goals: Patient/caregiver will verbalize understanding of skin care regimen Date Initiated: 11/10/2021 Target Resolution Date: 11/26/2021 Goal Status: Active Interventions: Assess ulceration(s) every visit Provide education on smoking Provide education on ulcer and skin care Treatment Activities: Skin care regimen initiated : 11/10/2021 Topical wound management initiated : 11/10/2021 Notes: Electronic Signature(s) Signed: 11/10/2021 5:20:05 PM By: Deon Pilling RN, BSN Entered By: Deon Pilling on 11/10/2021 09:34:44 -------------------------------------------------------------------------------- Pain Assessment Details Patient Name: Date of Service: Clinton Picket D. 11/10/2021 9:00 A M Medical Record Number: [id number] Patient Account Number: [id number] Date of Birth/Sex: Treating RN: 12/19/1986 (35 y.o. [name] Primary Care Provider: PA Haig Prophet, NO Other Clinician: Referring Provider: Treating Provider/Extender: Yaakov Guthrie in Treatment: 0 Active Problems Location of Pain Severity and Description of Pain Patient Has Paino Yes Site Locations Pain Location: Pain  in Ulcers Rate the pain. Current Pain Level: 10 Worst Pain Level: 10 Least Pain Level: 0 Tolerable Pain Level: 8 Character of Pain Describe the Pain: Aching, Heavy, Sharp Pain Management and Medication Current Pain Management: Medication: No Cold Application: No Rest: No Massage: No Activity: No T.E.N.S.: No Heat Application: No Leg  drop or elevation: No Is the Current Pain Management Adequate: Adequate How does your wound impact your activities of daily livingo Sleep: No Bathing: No Appetite: No Relationship With Others: No Bladder Continence: No Emotions: No Bowel Continence: No Work: No Toileting: No Drive: No Dressing: No Hobbies: No Engineer, maintenance) Signed: 11/10/2021 5:20:05 PM By: Deon Pilling RN, BSN Entered By: Deon Pilling on 11/10/2021 09:30:21 -------------------------------------------------------------------------------- Patient/Caregiver Education Details Patient Name: Date of Service: Clinton Picket D. 11/22/2022andnbsp9:00 A M Medical Record Number: [id number] Patient Account Number: [id number] Date of Birth/Gender: Treating RN: 01-Jan-1986 (35 y.o. [name] Primary Care Physician: PA Haig Prophet, NO Other Clinician: Referring Physician: Treating Physician/Extender: Yaakov Guthrie in Treatment: 0 Education Assessment Education Provided To: Patient Education Topics Provided Welcome T [facility]: o Handouts: Welcome T [facility] o Methods: Explain/Verbal, Printed Responses: Reinforcements needed Electronic Signature(s) Signed: 11/10/2021 5:20:05 PM By: Deon Pilling RN, BSN Entered By: Deon Pilling on 11/10/2021 09:34:53 -------------------------------------------------------------------------------- Wound Assessment Details Patient Name: Date of Service: Clinton Picket D. 11/10/2021 9:00 A M Medical Record Number: [id number] Patient Account Number: [id number] Date of Birth/Sex: Treating RN: [birth year] (35 y.o. [name] Primary Care Provider: PA TIENT, NO Other Clinician: Referring Provider: Treating Provider/Extender: Yaakov Guthrie in Treatment: 0 Wound Status Wound Number: 1 Primary Etiology: Abrasion Wound Location: Left, Medial Upper Leg Wound Status: Open Wounding Event: Trauma Date Acquired:  10/19/2021 Weeks Of Treatment: 0 Clustered Wound: No Photos Wound Measurements Length: (cm) 1.6 Width: (cm) 3.5 Depth: (cm) 1.1 Area: (cm) 4.398 Volume: (cm) 4.838 % Reduction in Area: 0% % Reduction in Volume: 0% Epithelialization: None Tunneling: Yes Position (o'clock): 3 Maximum Distance: (cm) 6.5 Undermining: Yes Starting Position (o'clock): 4 Ending Position (o'clock): 6 Maximum Distance: (cm) 1.8 Wound Description Classification: Full Thickness Without Exposed Support Structures Wound Margin: Distinct, outline attached Exudate Amount: Large Exudate Type: Purulent Exudate Color: yellow, brown, green Foul Odor After Cleansing: No Slough/Fibrino Yes Wound Bed Granulation Amount: Large (67-100%) Exposed Structure Granulation Quality: Red, Pink Fascia Exposed: No Necrotic Amount: Small (1-33%) Fat Layer (Subcutaneous Tissue) Exposed: Yes Necrotic Quality: Adherent Slough Tendon Exposed: No Muscle Exposed: No Joint Exposed: No Bone Exposed: No Treatment Notes Wound #1 (Upper Leg) Wound Laterality: Left, Medial Cleanser Soap and Water Discharge Instruction: May shower and wash wound with dial antibacterial soap and water prior to dressing change. Peri-Wound Care Topical Primary Dressing Santyl Ointment Discharge Instruction: Apply directly to wound bed. Iodoform packing strip 1/4 (in) Discharge Instruction: Lightly pack into tunnel and undermining. Secondary Dressing ABD Pad, 8x10 Discharge Instruction: Apply over primary dressing as directed. Secured With 64M Medipore H Soft Cloth Surgical T ape, 4 x 10 (in/yd) Discharge Instruction: Secure with tape as directed. Compression Wrap Compression Stockings Add-Ons Electronic Signature(s) Signed: 11/10/2021 5:20:05 PM By: Deon Pilling RN, BSN Entered By: Deon Pilling on 11/10/2021 10:00:50 -------------------------------------------------------------------------------- Wound Assessment Details Patient  Name: Date of Service: Clinton Picket D. 11/10/2021 9:00 A M Medical Record Number: [id number] Patient Account Number: [id number] Date of Birth/Sex: Treating RN: 05/22/1986 (35 y.o. [name] Primary Care Provider: PA Darnelle Spangle Other Clinician:  Referring Amit Meloy: Treating Shyheem Whitham/Extender: Yaakov Guthrie in Treatment: 0 Wound Status Wound Number: 2 Primary Etiology: Abrasion Wound Location: Left, Anterior Upper Leg Wound Status: Open Wounding Event: Trauma Date Acquired: 10/19/2021 Weeks Of Treatment: 0 Clustered Wound: No Photos Wound Measurements Length: (cm) 0.5 Width: (cm) 5.2 Depth: (cm) 0.1 Area: (cm) 2.042 Volume: (cm) 0.204 % Reduction in Area: 0% % Reduction in Volume: 0% Epithelialization: None Tunneling: No Undermining: No Wound Description Classification: Unclassifiable Wound Margin: Distinct, outline attached Exudate Amount: None Present Foul Odor After Cleansing: No Slough/Fibrino No Wound Bed Granulation Amount: None Present (0%) Exposed Structure Necrotic Amount: Large (67-100%) Fascia Exposed: No Necrotic Quality: Eschar Fat Layer (Subcutaneous Tissue) Exposed: No Tendon Exposed: No Muscle Exposed: No Joint Exposed: No Bone Exposed: No Assessment Notes x4 sutures noted. Treatment Notes Wound #2 (Upper Leg) Wound Laterality: Left, Anterior Cleanser Soap and Water Discharge Instruction: May shower and wash wound with dial antibacterial soap and water prior to dressing change. Peri-Wound Care Skin Prep Discharge Instruction: Use skin prep as directed Topical Primary Dressing Santyl Ointment Discharge Instruction: Apply nickel thick amount to wound bed as instructed Secondary Dressing ABD Pad, 8x10 Discharge Instruction: Apply over primary dressing as directed. Secured With 47M Medipore H Soft Cloth Surgical T ape, 4 x 10 (in/yd) Discharge Instruction: Secure with tape as directed. Compression Wrap Compression  Stockings Add-Ons Electronic Signature(s) Signed: 11/10/2021 5:20:05 PM By: Deon Pilling RN, BSN Entered By: Deon Pilling on 11/10/2021 10:01:08 -------------------------------------------------------------------------------- Vitals Details Patient Name: Date of Service: Clinton Picket D. 11/10/2021 9:00 A M Medical Record Number: [id number] Patient Account Number: [id number] Date of Birth/Sex: Treating RN: [birth year] (35 y.o. [name] Primary Care Coryn Mosso: PA TIENT, NO Other Clinician: Referring Yusif Gnau: Treating Jacobi Ryant/Extender: Yaakov Guthrie in Treatment: 0 Vital Signs Time Taken: 09:10 Temperature (F): 98.4 Height (in): 68 Pulse (bpm): 98 Source: Stated Respiratory Rate (breaths/min): 16 Weight (lbs): 250 Blood Pressure (mmHg): 112/77 Source: Stated Reference Range: 80 - 120 mg / dl Body Mass Index (BMI): 38 Electronic Signature(s) Signed: 11/10/2021 5:20:05 PM By: Deon Pilling RN, BSN Entered By: Deon Pilling on 11/10/2021 09:17:40

## 2021-11-10 NOTE — Progress Notes (Signed)
[name] ([id number]) Visit Report for 11/10/2021 Abuse/Suicide Risk Screen Details Patient Name: Date of Service: [name] 11/10/2021 9:00 A M Medical Record Number: [id number] Patient Account Number: [id number] Date of Birth/Sex: Treating RN: Aug 18, 1986 (35 y.o. [name] Primary Care Steen Bisig: PA Zenovia Jordan, NO Other Clinician: Referring Sheresa Cullop: Treating Smith Potenza/Extender: Tilda Franco in Treatment: 0 Abuse/Suicide Risk Screen Items Answer ABUSE RISK SCREEN: Has anyone close to you tried to hurt or harm you recentlyo No Do you feel uncomfortable with anyone in your familyo No Has anyone forced you do things that you didnt want to doo No Electronic Signature(s) Signed: 11/10/2021 5:20:05 PM By: Shawn Stall RN, BSN Entered By: Shawn Stall on 11/10/2021 09:16:33 -------------------------------------------------------------------------------- Activities of Daily Living Details Patient Name: Date of Service: Clinton Foley D. 11/10/2021 9:00 A M Medical Record Number: [id number] Patient Account Number: [id number] Date of Birth/Sex: Treating RN: [birth year] (35 y.o. [name] Primary Care Lennix Rotundo: PA Zenovia Jordan, NO Other Clinician: Referring Jonique Kulig: Treating Kendallyn Lippold/Extender: Tilda Franco in Treatment: 0 Activities of Daily Living Items Answer Activities of Daily Living (Please select one for each item) Drive Automobile Completely Able T Medications ake Completely Able Use T elephone Completely Able Care for Appearance Completely Able Use T oilet Completely Able Bath / Shower Completely Able Dress Self Completely Able Feed Self Completely Able Walk Completely Able Get In / Out Bed Completely Able Housework Completely Able Prepare Meals Completely Able Handle Money Completely Able Shop for Self Completely Able Electronic Signature(s) Signed: 11/10/2021 5:20:05 PM By: Shawn Stall RN, BSN Entered By: Shawn Stall on 11/10/2021 09:27:01 -------------------------------------------------------------------------------- Education Screening Details Patient Name: Date of Service: Clinton Foley D. 11/10/2021 9:00 A M Medical Record Number: [id number] Patient Account Number: [id number] Date of Birth/Sex: Treating RN: 04-30-1986 (35 y.o. [name] Primary Care Mckinsley Koelzer: PA Zenovia Jordan, NO Other Clinician: Referring Chisum Habenicht: Treating Noa Galvao/Extender: Tilda Franco in Treatment: 0 Primary Learner Assessed: Patient Learning Preferences/Education Level/Primary Language Learning Preference: Explanation, Demonstration, Printed Material Highest Education Level: College or Above Preferred Language: English Cognitive Barrier Language Barrier: No Translator Needed: No Memory Deficit: No Emotional Barrier: No Cultural/Religious Beliefs Affecting Medical Care: No Physical Barrier Impaired Vision: No Impaired Hearing: No Decreased Hand dexterity: No Knowledge/Comprehension Knowledge Level: High Comprehension Level: High Ability to understand written instructions: High Ability to understand verbal instructions: High Motivation Anxiety Level: Calm Cooperation: Cooperative Education Importance: Acknowledges Need Interest in Health Problems: Asks Questions Perception: Coherent Willingness to Engage in Self-Management High Activities: Readiness to Engage in Self-Management High Activities: Electronic Signature(s) Signed: 11/10/2021 5:20:05 PM By: Shawn Stall RN, BSN Entered By: Shawn Stall on 11/10/2021 09:27:18 -------------------------------------------------------------------------------- Fall Risk Assessment Details Patient Name: Date of Service: Clinton Foley D. 11/10/2021 9:00 A M Medical Record Number: [id number] Patient Account Number: [id number] Date of Birth/Sex: Treating RN: 07-Jul-1986 (35 y.o. [name] Primary Care Leonia Heatherly: PA Zenovia Jordan, NO Other  Clinician: Referring Jeramey Lanuza: Treating Estefan Pattison/Extender: Tilda Franco in Treatment: 0 Fall Risk Assessment Items Have you had 2 or more falls in the last 12 monthso 0 No Have you had any fall that resulted in injury in the last 12 monthso 0 Yes FALLS RISK SCREEN History of falling - immediate or within 3 months 25 Yes Secondary diagnosis (Do you have 2 or more medical diagnoseso) 0 No Ambulatory aid None/bed rest/wheelchair/nurse 0 Yes Crutches/cane/walker 0 No Furniture 0 No Intravenous therapy Access/Saline/Heparin Lock 0 No Gait/Transferring Normal/  bed rest/ wheelchair 0 Yes Weak (short steps with or without shuffle, stooped but able to lift head while walking, may seek 0 No support from furniture) Impaired (short steps with shuffle, may have difficulty arising from chair, head down, impaired 0 No balance) Mental Status Oriented to own ability 0 Yes Electronic Signature(s) Signed: 11/10/2021 5:20:05 PM By: Shawn Stall RN, BSN Entered By: Shawn Stall on 11/10/2021 09:27:39 -------------------------------------------------------------------------------- Foot Assessment Details Patient Name: Date of Service: Clinton Foley D. 11/10/2021 9:00 A M Medical Record Number: [id number] Patient Account Number: [id number] Date of Birth/Sex: Treating RN: 1986/04/03 (35 y.o. [name] Primary Care Abir Eroh: PA TIENT, NO Other Clinician: Referring Ethan Kasperski: Treating Chareese Sergent/Extender: Tilda Franco in Treatment: 0 Foot Assessment Items Site Locations + = Sensation present, - = Sensation absent, C = Callus, U = Ulcer R = Redness, W = Warmth, M = Maceration, PU = Pre-ulcerative lesion F = Fissure, S = Swelling, D = Dryness Assessment Right: Left: Other Deformity: No No Prior Foot Ulcer: No No Prior Amputation: No No Charcot Joint: No No Ambulatory Status: Gait: Notes No BLE wounds. Electronic Signature(s) Signed: 11/10/2021 5:20:05 PM By:  Shawn Stall RN, BSN Entered By: Shawn Stall on 11/10/2021 09:28:02 -------------------------------------------------------------------------------- Nutrition Risk Screening Details Patient Name: Date of Service: Clinton Foley D. 11/10/2021 9:00 A M Medical Record Number: [id number] Patient Account Number: [id number] Date of Birth/Sex: Treating RN: 05/17/1986 (35 y.o. [name] Primary Care Clella Mckeel: PA TIENT, NO Other Clinician: Referring Blyss Lugar: Treating Tonantzin Mimnaugh/Extender: Tilda Franco in Treatment: 0 Height (in): 68 Weight (lbs): 250 Body Mass Index (BMI): 38 Nutrition Risk Screening Items Score Screening NUTRITION RISK SCREEN: I have an illness or condition that made me change the kind and/or amount of food I eat 0 No I eat fewer than two meals per day 0 No I eat few fruits and vegetables, or milk products 0 No I have three or more drinks of beer, liquor or wine almost every day 0 No I have tooth or mouth problems that make it hard for me to eat 0 No I don't always have enough money to buy the food I need 0 No I eat alone most of the time 0 No I take three or more different prescribed or over-the-counter drugs a day 0 No Without wanting to, I have lost or gained 10 pounds in the last six months 0 No I am not always physically able to shop, cook and/or feed myself 0 No Nutrition Protocols Good Risk Protocol 0 No interventions needed Moderate Risk Protocol High Risk Proctocol Risk Level: Good Risk Score: 0 Electronic Signature(s) Signed: 11/10/2021 5:20:05 PM By: Shawn Stall RN, BSN Entered By: Shawn Stall on 11/10/2021 09:27:54
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
## 2021-11-17 ENCOUNTER — Other Ambulatory Visit: Payer: Self-pay

## 2021-11-17 ENCOUNTER — Encounter (HOSPITAL_BASED_OUTPATIENT_CLINIC_OR_DEPARTMENT_OTHER): Payer: Self-pay | Admitting: Internal Medicine

## 2021-11-17 DIAGNOSIS — L97822 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ed: Secondary | ICD-10-CM

## 2021-11-17 NOTE — Progress Notes (Signed)
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 Description L97.822 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ed Quantity: 1 Electronic Signature(s) Signed: 11/17/2021 10:31:47 AM By: [name] DO Entered By: [name] on 11/17/2021 10:31:32

## 2021-11-17 NOTE — Progress Notes (Signed)
[name] ([id number]) Visit Report for 11/17/2021 Arrival Information Details Patient Name: Date of Service: [name] 11/17/2021 9:30 A M Medical Record Number: [id number] Patient Account Number: [id number] Date of Birth/Sex: Treating RN: [birth year] (35 y.o. [name], Meta.Reding Primary Care Kiri Hinderliter: PA Haig Prophet, NO Other Clinician: Referring Ottie Tillery: Treating Kasia Trego/Extender: Yaakov Guthrie in Treatment: 1 Visit Information History Since Last Visit Added or deleted any medications: No Patient Arrived: Ambulatory Any new allergies or adverse reactions: No Arrival Time: 09:41 Had a fall or experienced change in No Accompanied By: self activities of daily living that may affect Transfer Assistance: None risk of falls: Patient Requires Transmission-Based Precautions: No Signs or symptoms of abuse/neglect since last visito No Patient Has Alerts: No Hospitalized since last visit: No Implantable device outside of the clinic excluding No cellular tissue based products placed in the center since last visit: Has Dressing in Place as Prescribed: Yes Pain Present Now: No Electronic Signature(s) Signed: 11/17/2021 5:02:58 PM By: Dellie Catholic RN Entered By: Dellie Catholic on 11/17/2021 09:42:22 -------------------------------------------------------------------------------- Lower Extremity Assessment Details Patient Name: Date of Service: Clinton Picket D. 11/17/2021 9:30 A M Medical Record Number: [id number] Patient Account Number: [id number] Date of Birth/Sex: Treating RN: [birth year] (35 y.o. [name] Primary Care Dymin Dingledine: PA Darnelle Spangle Other Clinician: Referring Jarrod Mcenery: Treating Telicia Hodgkiss/Extender: Yaakov Guthrie in Treatment: 1 Electronic Signature(s) Signed: 11/17/2021 5:02:58 PM By: Dellie Catholic RN Signed: 11/17/2021 5:44:57 PM By: Deon Pilling RN, BSN Entered By: Dellie Catholic on 11/17/2021  09:43:12 -------------------------------------------------------------------------------- Multi Wound Chart Details Patient Name: Date of Service: Clinton Picket D. 11/17/2021 9:30 A M Medical Record Number: [id number] Patient Account Number: [id number] Date of Birth/Sex: Treating RN: 1986/09/18 (35 y.o. [name] Primary Care Saleena Tamas: PA TIENT, NO Other Clinician: Referring Kattleya Kuhnert: Treating Andreya Lacks/Extender: Yaakov Guthrie in Treatment: 1 Vital Signs Height(in): 68 Pulse(bpm): 82 Weight(lbs): 250 Blood Pressure(mmHg): 121/89 Body Mass Index(BMI): 38 Temperature(F): 98.8 Respiratory Rate(breaths/min): 16 Photos: [N/A:N/A] Left, Medial Upper Leg Left, Anterior Upper Leg N/A Wound Location: Trauma Trauma N/A Wounding Event: Abrasion Abrasion N/A Primary Etiology: 10/19/2021 10/19/2021 N/A Date Acquired: 1 1 N/A Weeks of Treatment: Open Open N/A Wound Status: 1.4x2.2x1 1x4.5x1.1 N/A Measurements L x W x D (cm) 2.419 3.534 N/A A (cm) : rea 2.419 3.888 N/A Volume (cm) : 45.00% -73.10% N/A % Reduction in A rea: 50.00% -1805.90% N/A % Reduction in Volume: 4 9 Position 1 (o'clock): 6.1 3.5 Maximum Distance 1 (cm): 3 Position 2 (o'clock): 3.5 Maximum Distance 2 (cm): 5 Starting Position 1 (o'clock): 7 Ending Position 1 (o'clock): 0.5 Maximum Distance 1 (cm): Yes Yes N/A Tunneling: Yes No N/A Undermining: Full Thickness Without Exposed Full Thickness Without Exposed N/A Classification: Support Structures Support Structures Medium Medium N/A Exudate A mount: Serosanguineous Serosanguineous N/A Exudate Type: red, brown red, brown N/A Exudate Color: Distinct, outline attached Distinct, outline attached N/A Wound Margin: Large (67-100%) Medium (34-66%) N/A Granulation A mount: Red, Pink Red, Pink N/A Granulation Quality: Small (1-33%) Medium (34-66%) N/A Necrotic A mount: Adherent Slough Eschar N/A Necrotic Tissue: Fat Layer  (Subcutaneous Tissue): Yes Fat Layer (Subcutaneous Tissue): Yes N/A Exposed Structures: Fascia: No Fascia: No Tendon: No Tendon: No Muscle: No Muscle: No Joint: No Joint: No Bone: No Bone: No Small (1-33%) Small (1-33%) N/A Epithelialization: Debridement - Excisional Debridement - Excisional N/A Debridement: Pre-procedure Verification/Time Out 10:00 10:00 N/A Taken: Other Other N/A Pain Control: Subcutaneous, Slough Subcutaneous, Slough N/A Tissue Debrided:  Skin/Subcutaneous Tissue Skin/Subcutaneous Tissue N/A Level: 3.08 4.5 N/A Debridement A (sq cm): rea Curette Curette N/A Instrument: Minimum Minimum N/A Bleeding: Pressure Pressure N/A Hemostasis A chieved: 0 0 N/A Procedural Pain: 2 2 N/A Post Procedural Pain: Procedure was tolerated well Procedure was tolerated well N/A Debridement Treatment Response: 1.4x2.2x1 1x4.5x1.1 N/A Post Debridement Measurements L x W x D (cm) 2.419 3.888 N/A Post Debridement Volume: (cm) wounds #1 and #2 connects via tunnel. wounds #1 and #2 connects via tunnel. N/A Assessment Notes: induration noted to periwound. Debridement Debridement N/A Procedures Performed: Treatment Notes Electronic Signature(s) Signed: 11/17/2021 10:31:47 AM By: [name] DO Signed: 11/17/2021 5:44:57 PM By: Deon Pilling RN, BSN Entered By: [name] on 11/17/2021 10:17:09 -------------------------------------------------------------------------------- Multi-Disciplinary Care Plan Details Patient Name: Date of Service: Clinton Picket D. 11/17/2021 9:30 A M Medical Record Number: [id number] Patient Account Number: [id number] Date of Birth/Sex: Treating RN: March 07, 1986 (35 y.o. [name] Primary Care Ioannis Schuh: PA Haig Prophet, NO Other Clinician: Referring Bentlie Withem: Treating Ceola Para/Extender: Yaakov Guthrie in Treatment: 1 Active Inactive Pain, Acute or Chronic Nursing Diagnoses: Pain, acute or chronic: actual or  potential Potential alteration in comfort, pain Goals: Patient will verbalize adequate pain control and receive pain control interventions during procedures as needed Date Initiated: 11/10/2021 Target Resolution Date: 11/26/2021 Goal Status: Active Patient/caregiver will verbalize comfort level met Date Initiated: 11/10/2021 Target Resolution Date: 11/26/2021 Goal Status: Active Interventions: Encourage patient to take pain medications as prescribed Provide education on pain management Reposition patient for comfort Treatment Activities: Administer pain control measures as ordered : 11/10/2021 Notes: Wound/Skin Impairment Nursing Diagnoses: Knowledge deficit related to ulceration/compromised skin integrity Goals: Patient/caregiver will verbalize understanding of skin care regimen Date Initiated: 11/10/2021 Target Resolution Date: 11/26/2021 Goal Status: Active Interventions: Assess ulceration(s) every visit Provide education on smoking Provide education on ulcer and skin care Treatment Activities: Skin care regimen initiated : 11/10/2021 Topical wound management initiated : 11/10/2021 Notes: Electronic Signature(s) Signed: 11/17/2021 5:44:57 PM By: Deon Pilling RN, BSN Entered By: Deon Pilling on 11/17/2021 10:09:08 -------------------------------------------------------------------------------- Pain Assessment Details Patient Name: Date of Service: Clinton Picket D. 11/17/2021 9:30 A M Medical Record Number: [id number] Patient Account Number: [id number] Date of Birth/Sex: Treating RN: [birth year] (35 y.o. [name] Primary Care Madysyn Hanken: PA Haig Prophet, NO Other Clinician: Referring Terrell Shimko: Treating Janaria Mccammon/Extender: Yaakov Guthrie in Treatment: 1 Active Problems Location of Pain Severity and Description of Pain Patient Has Paino No Site Locations Pain Management and Medication Current Pain Management: Electronic Signature(s) Signed: 11/17/2021  5:02:58 PM By: Dellie Catholic RN Signed: 11/17/2021 5:44:57 PM By: Deon Pilling RN, BSN Entered By: Dellie Catholic on 11/17/2021 09:42:56 -------------------------------------------------------------------------------- Patient/Caregiver Education Details Patient Name: Date of Service: [name] 11/29/2022andnbsp9:30 A M Medical Record Number: [id number] Patient Account Number: [id number] Date of Birth/Gender: Treating RN: 1986-04-20 (35 y.o. [name] Primary Care Physician: PA Haig Prophet, NO Other Clinician: Referring Physician: Treating Physician/Extender: Yaakov Guthrie in Treatment: 1 Education Assessment Education Provided To: Patient Education Topics Provided Pain: Handouts: A Guide to Pain Control Methods: Explain/Verbal Responses: Reinforcements needed Electronic Signature(s) Signed: 11/17/2021 5:44:57 PM By: Deon Pilling RN, BSN Entered By: Deon Pilling on 11/17/2021 10:09:30 -------------------------------------------------------------------------------- Wound Assessment Details Patient Name: Date of Service: Clinton Picket D. 11/17/2021 9:30 A M Medical Record Number: [id number] Patient Account Number: [id number] Date of Birth/Sex: Treating RN: 09-01-1986 (35 y.o. [name] Primary Care Gwendoline Judy: PA Darnelle Spangle Other Clinician: Referring Kemarion Abbey:  Treating Abbigaile Rockman/Extender: Yaakov Guthrie in Treatment: 1 Wound Status Wound Number: 1 Primary Etiology: Abrasion Wound Location: Left, Medial Upper Leg Wound Status: Open Wounding Event: Trauma Date Acquired: 10/19/2021 Weeks Of Treatment: 1 Clustered Wound: No Photos Wound Measurements Length: (cm) 1.4 Width: (cm) 2.2 Depth: (cm) 1 Area: (cm) 2.419 Volume: (cm) 2.419 % Reduction in Area: 45% % Reduction in Volume: 50% Epithelialization: Small (1-33%) Tunneling: Yes Location 1 Position (o'clock): 4 Maximum Distance: (cm) 6.1 Location 2 Position  (o'clock): 3 Maximum Distance: (cm) 3.5 Undermining: Yes Starting Position (o'clock): 5 Ending Position (o'clock): 7 Maximum Distance: (cm) 0.5 Wound Description Classification: Full Thickness Without Exposed Support Structu Wound Margin: Distinct, outline attached Exudate Amount: Medium Exudate Type: Serosanguineous Exudate Color: red, brown res Foul Odor After Cleansing: No Slough/Fibrino Yes Wound Bed Granulation Amount: Large (67-100%) Exposed Structure Granulation Quality: Red, Pink Fascia Exposed: No Necrotic Amount: Small (1-33%) Fat Layer (Subcutaneous Tissue) Exposed: Yes Necrotic Quality: Adherent Slough Tendon Exposed: No Muscle Exposed: No Joint Exposed: No Bone Exposed: No Assessment Notes wounds #1 and #2 connects via tunnel. induration noted to periwound. Electronic Signature(s) Signed: 11/17/2021 5:44:57 PM By: Deon Pilling RN, BSN Entered By: Deon Pilling on 11/17/2021 10:12:03 -------------------------------------------------------------------------------- Wound Assessment Details Patient Name: Date of Service: Clinton Picket D. 11/17/2021 9:30 A M Medical Record Number: [id number] Patient Account Number: [id number] Date of Birth/Sex: Treating RN: Jul 31, 1986 (35 y.o. [name] Primary Care Lachae Hohler: PA TIENT, NO Other Clinician: Referring Aren Cherne: Treating Joye Wesenberg/Extender: Yaakov Guthrie in Treatment: 1 Wound Status Wound Number: 2 Primary Etiology: Abrasion Wound Location: Left, Anterior Upper Leg Wound Status: Open Wounding Event: Trauma Date Acquired: 10/19/2021 Weeks Of Treatment: 1 Clustered Wound: No Photos Wound Measurements Length: (cm) 1 Width: (cm) 4.5 Depth: (cm) 1.1 Area: (cm) 3.534 Volume: (cm) 3.888 % Reduction in Area: -73.1% % Reduction in Volume: -1805.9% Epithelialization: Small (1-33%) Tunneling: Yes Position (o'clock): 9 Maximum Distance: (cm) 3.5 Undermining: No Wound  Description Classification: Full Thickness Without Exposed Support Structures Wound Margin: Distinct, outline attached Exudate Amount: Medium Exudate Type: Serosanguineous Exudate Color: red, brown Foul Odor After Cleansing: No Slough/Fibrino No Wound Bed Granulation Amount: Medium (34-66%) Exposed Structure Granulation Quality: Red, Pink Fascia Exposed: No Necrotic Amount: Medium (34-66%) Fat Layer (Subcutaneous Tissue) Exposed: Yes Necrotic Quality: Eschar Tendon Exposed: No Muscle Exposed: No Joint Exposed: No Bone Exposed: No Assessment Notes wounds #1 and #2 connects via tunnel. Electronic Signature(s) Signed: 11/17/2021 5:44:57 PM By: Deon Pilling RN, BSN Entered By: Deon Pilling on 11/17/2021 10:07:58 -------------------------------------------------------------------------------- Vitals Details Patient Name: Date of Service: Clinton Picket D. 11/17/2021 9:30 A M Medical Record Number: [id number] Patient Account Number: [id number] Date of Birth/Sex: Treating RN: [birth year] (35 y.o. [name] Primary Care Margrete Delude: PA TIENT, NO Other Clinician: Referring Criselda Starke: Treating Naome Brigandi/Extender: Yaakov Guthrie in Treatment: 1 Vital Signs Time Taken: 09:41 Temperature (F): 98.8 Height (in): 68 Pulse (bpm): 82 Weight (lbs): 250 Respiratory Rate (breaths/min): 16 Body Mass Index (BMI): 38 Blood Pressure (mmHg): 121/89 Reference Range: 80 - 120 mg / dl Electronic Signature(s) Signed: 11/17/2021 5:02:58 PM By: Dellie Catholic RN Entered By: Dellie Catholic on 11/17/2021 09:42:47

## 2021-11-24 ENCOUNTER — Encounter (HOSPITAL_BASED_OUTPATIENT_CLINIC_OR_DEPARTMENT_OTHER): Payer: Self-pay | Attending: Internal Medicine | Admitting: Internal Medicine

## 2021-11-24 ENCOUNTER — Other Ambulatory Visit: Payer: Self-pay

## 2021-11-24 DIAGNOSIS — T8130XA Disruption of wound, unspecified, initial encounter: Secondary | ICD-10-CM

## 2021-11-24 DIAGNOSIS — Y838 Other surgical procedures as the cause of abnormal reaction of the patient, or of later complication, without mention of misadventure at the time of the procedure: Secondary | ICD-10-CM | POA: Insufficient documentation

## 2021-11-24 DIAGNOSIS — T8131XA Disruption of external operation (surgical) wound, not elsewhere classified, initial encounter: Secondary | ICD-10-CM | POA: Insufficient documentation

## 2021-11-24 DIAGNOSIS — L97822 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ed: Secondary | ICD-10-CM | POA: Insufficient documentation

## 2021-11-24 DIAGNOSIS — W3400XD Accidental discharge from unspecified firearms or gun, subsequent encounter: Secondary | ICD-10-CM

## 2021-11-24 NOTE — Progress Notes (Signed)
WAYLYN, TENBRINK ([id number]) Visit Report for 11/24/2021 Arrival Information Details Patient Name: Date of Service: [name] 11/24/2021 9:30 A M Medical Record Number: [id number] Patient Account Number: [id number] Date of Birth/Sex: Treating RN: 11-Nov-1986 (35 y.o. Lorette Ang, Meta.Reding Primary Care Laraine Samet: PA Haig Prophet, NO Other Clinician: Referring Donney Caraveo: Treating Danney Bungert/Extender: Yaakov Guthrie in Treatment: 2 Visit Information History Since Last Visit Added or deleted any medications: No Patient Arrived: Ambulatory Any new allergies or adverse reactions: No Arrival Time: 09:48 Had a fall or experienced change in No Accompanied By: self activities of daily living that may affect Transfer Assistance: None risk of falls: Patient Identification Verified: Yes Signs or symptoms of abuse/neglect since last visito No Secondary Verification Process Completed: Yes Hospitalized since last visit: No Patient Requires Transmission-Based Precautions: No Implantable device outside of the clinic excluding No Patient Has Alerts: No cellular tissue based products placed in the center since last visit: Has Dressing in Place as Prescribed: Yes Pain Present Now: No Electronic Signature(s) Signed: 11/24/2021 5:00:25 PM By: Deon Pilling RN, BSN Entered By: Deon Pilling on 11/24/2021 09:49:18 -------------------------------------------------------------------------------- Clinic Level of Care Assessment Details Patient Name: Date of Service: Clinton Picket D. 11/24/2021 9:30 A M Medical Record Number: [id number] Patient Account Number: [id number] Date of Birth/Sex: Treating RN: 04/26/1986 (35 y.o. [name] Primary Care Eneida Evers: PA TIENT, NO Other Clinician: Referring Magdalina Whitehead: Treating Bernell Sigal/Extender: Yaakov Guthrie in Treatment: 2 Clinic Level of Care Assessment Items TOOL 4 Quantity Score X- 1 0 Use when only an EandM is performed on FOLLOW-UP  visit ASSESSMENTS - Nursing Assessment / Reassessment X- 1 10 Reassessment of Co-morbidities (includes updates in patient status) X- 1 5 Reassessment of Adherence to Treatment Plan ASSESSMENTS - Wound and Skin A ssessment / Reassessment []  - 0 Simple Wound Assessment / Reassessment - one wound X- 2 5 Complex Wound Assessment / Reassessment - multiple wounds X- 1 10 Dermatologic / Skin Assessment (not related to wound area) ASSESSMENTS - Focused Assessment []  - 0 Circumferential Edema Measurements - multi extremities []  - 0 Nutritional Assessment / Counseling / Intervention []  - 0 Lower Extremity Assessment (monofilament, tuning fork, pulses) []  - 0 Peripheral Arterial Disease Assessment (using hand held doppler) ASSESSMENTS - Ostomy and/or Continence Assessment and Care []  - 0 Incontinence Assessment and Management []  - 0 Ostomy Care Assessment and Management (repouching, etc.) PROCESS - Coordination of Care []  - 0 Simple Patient / Family Education for ongoing care X- 1 20 Complex (extensive) Patient / Family Education for ongoing care X- 1 10 Staff obtains Programmer, systems, Records, T Results / Process Orders est []  - 0 Staff telephones HHA, Nursing Homes / Clarify orders / etc []  - 0 Routine Transfer to another Facility (non-emergent condition) []  - 0 Routine Hospital Admission (non-emergent condition) []  - 0 New Admissions / Biomedical engineer / Ordering NPWT Apligraf, etc. , []  - 0 Emergency Hospital Admission (emergent condition) []  - 0 Simple Discharge Coordination X- 1 15 Complex (extensive) Discharge Coordination PROCESS - Special Needs []  - 0 Pediatric / Minor Patient Management []  - 0 Isolation Patient Management []  - 0 Hearing / Language / Visual special needs []  - 0 Assessment of Community assistance (transportation, D/C planning, etc.) []  - 0 Additional assistance / Altered mentation []  - 0 Support Surface(s) Assessment (bed, cushion, seat,  etc.) INTERVENTIONS - Wound Cleansing / Measurement []  - 0 Simple Wound Cleansing - one wound X- 2 5 Complex Wound Cleansing - multiple  wounds X- 1 5 Wound Imaging (photographs - any number of wounds) []  - 0 Wound Tracing (instead of photographs) []  - 0 Simple Wound Measurement - one wound X- 2 5 Complex Wound Measurement - multiple wounds INTERVENTIONS - Wound Dressings X - Small Wound Dressing one or multiple wounds 2 10 []  - 0 Medium Wound Dressing one or multiple wounds []  - 0 Large Wound Dressing one or multiple wounds []  - 0 Application of Medications - topical []  - 0 Application of Medications - injection INTERVENTIONS - Miscellaneous []  - 0 External ear exam []  - 0 Specimen Collection (cultures, biopsies, blood, body fluids, etc.) []  - 0 Specimen(s) / Culture(s) sent or taken to Lab for analysis []  - 0 Patient Transfer (multiple staff / Civil Service fast streamer / Similar devices) []  - 0 Simple Staple / Suture removal (25 or less) []  - 0 Complex Staple / Suture removal (26 or more) []  - 0 Hypo / Hyperglycemic Management (close monitor of Blood Glucose) []  - 0 Ankle / Brachial Index (ABI) - do not check if billed separately X- 1 5 Vital Signs Has the patient been seen at the hospital within the last three years: Yes Total Score: 130 Level Of Care: New/Established - Level 4 Electronic Signature(s) Signed: 11/24/2021 5:00:25 PM By: Deon Pilling RN, BSN Entered By: Deon Pilling on 11/24/2021 10:00:41 -------------------------------------------------------------------------------- Encounter Discharge Information Details Patient Name: Date of Service: Clinton Picket D. 11/24/2021 9:30 A M Medical Record Number: [id number] Patient Account Number: [id number] Date of Birth/Sex: Treating RN: 01/21/1986 (35 y.o. [name] Primary Care Edison Nicholson: PA Haig Prophet, NO Other Clinician: Referring Alfrieda Tarry: Treating Shaketta Rill/Extender: Yaakov Guthrie in Treatment:  2 Encounter Discharge Information Items Discharge Condition: Stable Ambulatory Status: Ambulatory Discharge Destination: Home Transportation: Private Auto Accompanied By: self Schedule Follow-up Appointment: Yes Clinical Summary of Care: Electronic Signature(s) Signed: 11/24/2021 5:00:25 PM By: Deon Pilling RN, BSN Entered By: Deon Pilling on 11/24/2021 10:01:10 -------------------------------------------------------------------------------- Lower Extremity Assessment Details Patient Name: Date of Service: Clinton Picket D. 11/24/2021 9:30 A M Medical Record Number: [id number] Patient Account Number: [id number] Date of Birth/Sex: Treating RN: 1986-04-27 (35 y.o. [name] Primary Care Zauria Dombek: PA Darnelle Spangle Other Clinician: Referring Sharilyn Geisinger: Treating Isiaih Hollenbach/Extender: Yaakov Guthrie in Treatment: 2 Electronic Signature(s) Signed: 11/24/2021 5:00:25 PM By: Deon Pilling RN, BSN Entered By: Deon Pilling on 11/24/2021 09:50:34 -------------------------------------------------------------------------------- Multi Wound Chart Details Patient Name: Date of Service: Clinton Picket D. 11/24/2021 9:30 A M Medical Record Number: [id number] Patient Account Number: [id number] Date of Birth/Sex: Treating RN: 1986/09/03 (35 y.o. M) Primary Care Briyonna Omara: PA TIENT, NO Other Clinician: Referring Adina Puzzo: Treating Narmeen Kerper/Extender: Yaakov Guthrie in Treatment: 2 Vital Signs Height(in): 68 Pulse(bpm): 83 Weight(lbs): 250 Blood Pressure(mmHg): 128/96 Body Mass Index(BMI): 38 Temperature(F): 98 Respiratory Rate(breaths/min): 16 Photos: [N/A:N/A] Left, Medial Upper Leg Left, Anterior Upper Leg N/A Wound Location: Trauma Trauma N/A Wounding Event: Abrasion Abrasion N/A Primary Etiology: 10/19/2021 10/19/2021 N/A Date Acquired: 2 2 N/A Weeks of Treatment: Open Open N/A Wound Status: 0.9x2.7x1 1x3.9x0.6 N/A Measurements L x W x D (cm) 1.909  3.063 N/A A (cm) : rea 1.909 1.838 N/A Volume (cm) : 56.60% -50.00% N/A % Reduction in A rea: 60.50% -801.00% N/A % Reduction in Volume: 4 9 Position 1 (o'clock): 2.4 4.8 Maximum Distance 1 (cm): 3 Position 2 (o'clock): 4.8 Maximum Distance 2 (cm): Yes Yes N/A Tunneling: Full Thickness Without Exposed Full Thickness Without Exposed N/A Classification: Support Structures Support Structures  Medium Medium N/A Exudate Amount: Serosanguineous Serosanguineous N/A Exudate Type: red, brown red, brown N/A Exudate Color: Distinct, outline attached Distinct, outline attached N/A Wound Margin: Large (67-100%) Large (67-100%) N/A Granulation Amount: Red, Pink Red, Pink N/A Granulation Quality: Small (1-33%) Small (1-33%) N/A Necrotic Amount: Fat Layer (Subcutaneous Tissue): Yes Fat Layer (Subcutaneous Tissue): Yes N/A Exposed Structures: Fascia: No Fascia: No Tendon: No Tendon: No Muscle: No Muscle: No Joint: No Joint: No Bone: No Bone: No Small (1-33%) Small (1-33%) N/A Epithelialization: tunnel at 3 connects to wound #2. N/A N/A Assessment Notes: Treatment Notes Electronic Signature(s) Signed: 11/24/2021 10:03:49 AM By: [name] DO Entered By: [name] on 11/24/2021 10:00:52 -------------------------------------------------------------------------------- Multi-Disciplinary Care Plan Details Patient Name: Date of Service: Clinton Picket D. 11/24/2021 9:30 A M Medical Record Number: [id number] Patient Account Number: [id number] Date of Birth/Sex: Treating RN: 1986-10-11 (35 y.o. [name] Primary Care Ferol Laiche: PA Haig Prophet, NO Other Clinician: Referring Marie Borowski: Treating Keta [place]/Extender: Yaakov Guthrie in Treatment: 2 Active Inactive Pain, Acute or Chronic Nursing Diagnoses: Pain, acute or chronic: actual or potential Potential alteration in comfort, pain Goals: Patient will verbalize adequate pain control and receive  pain control interventions during procedures as needed Date Initiated: 11/10/2021 Target Resolution Date: 12/03/2021 Goal Status: Active Patient/caregiver will verbalize comfort level met Date Initiated: 11/10/2021 Target Resolution Date: 12/04/2021 Goal Status: Active Interventions: Encourage patient to take pain medications as prescribed Provide education on pain management Reposition patient for comfort Treatment Activities: Administer pain control measures as ordered : 11/10/2021 Notes: Wound/Skin Impairment Nursing Diagnoses: Knowledge deficit related to ulceration/compromised skin integrity Goals: Patient/caregiver will verbalize understanding of skin care regimen Date Initiated: 11/10/2021 Target Resolution Date: 12/03/2021 Goal Status: Active Interventions: Assess ulceration(s) every visit Provide education on smoking Provide education on ulcer and skin care Treatment Activities: Skin care regimen initiated : 11/10/2021 Topical wound management initiated : 11/10/2021 Notes: Electronic Signature(s) Signed: 11/24/2021 5:00:25 PM By: Deon Pilling RN, BSN Entered By: Deon Pilling on 11/24/2021 09:55:29 -------------------------------------------------------------------------------- Pain Assessment Details Patient Name: Date of Service: Clinton Picket D. 11/24/2021 9:30 A M Medical Record Number: [id number] Patient Account Number: [id number] Date of Birth/Sex: Treating RN: 1986/07/15 (35 y.o. [name] Primary Care Valor Quaintance: PA Haig Prophet, NO Other Clinician: Referring Declan Adamson: Treating Amparo Donalson/Extender: Yaakov Guthrie in Treatment: 2 Active Problems Location of Pain Severity and Description of Pain Patient Has Paino No Site Locations Rate the pain. Current Pain Level: 0 Pain Management and Medication Current Pain Management: Medication: No Cold Application: No Rest: No Massage: No Activity: No T.E.N.S.: No Heat Application: No Leg drop  or elevation: No Is the Current Pain Management Adequate: Adequate How does your wound impact your activities of daily livingo Sleep: No Bathing: No Appetite: No Relationship With Others: No Bladder Continence: No Emotions: No Bowel Continence: No Work: No Toileting: No Drive: No Dressing: No Hobbies: No Engineer, maintenance) Signed: 11/24/2021 5:00:25 PM By: Deon Pilling RN, BSN Entered By: Deon Pilling on 11/24/2021 09:50:12 -------------------------------------------------------------------------------- Patient/Caregiver Education Details Patient Name: Date of Service: Clinton Picket D. 12/6/2022andnbsp9:30 A M Medical Record Number: [id number] Patient Account Number: [id number] Date of Birth/Gender: Treating RN: [birth year] (35 y.o. [name] Primary Care Physician: PA Haig Prophet, NO Other Clinician: Referring Physician: Treating Physician/Extender: Yaakov Guthrie in Treatment: 2 Education Assessment Education Provided To: Patient Education Topics Provided Wound/Skin Impairment: Handouts: Skin Care Do's and Dont's Methods: Explain/Verbal Responses: Reinforcements needed Electronic Signature(s) Signed: 11/24/2021 5:00:25 PM By: Rolin Barry,  Tammi Klippel RN, BSN Signed: 11/24/2021 5:00:25 PM By: Deon Pilling RN, BSN Entered By: Deon Pilling on 11/24/2021 09:55:44 -------------------------------------------------------------------------------- Wound Assessment Details Patient Name: Date of Service: Clinton Picket D. 11/24/2021 9:30 A M Medical Record Number: [id number] Patient Account Number: [id number] Date of Birth/Sex: Treating RN: 1986-01-11 (35 y.o. [name] Primary Care Abdinasir Spadafore: PA TIENT, NO Other Clinician: Referring Modelle Vollmer: Treating Edris Schneck/Extender: Yaakov Guthrie in Treatment: 2 Wound Status Wound Number: 1 Primary Etiology: Abrasion Wound Location: Left, Medial Upper Leg Wound Status: Open Wounding Event: Trauma Date  Acquired: 10/19/2021 Weeks Of Treatment: 2 Clustered Wound: No Photos Wound Measurements Length: (cm) 0.9 Width: (cm) 2.7 Depth: (cm) 1 Area: (cm) 1.909 Volume: (cm) 1.909 % Reduction in Area: 56.6% % Reduction in Volume: 60.5% Epithelialization: Small (1-33%) Tunneling: Yes Location 1 Position (o'clock): 4 Maximum Distance: (cm) 2.4 Location 2 Position (o'clock): 3 Maximum Distance: (cm) 4.8 Undermining: No Wound Description Classification: Full Thickness Without Exposed Support Structures Wound Margin: Distinct, outline attached Exudate Amount: Medium Exudate Type: Serosanguineous Exudate Color: red, brown Foul Odor After Cleansing: No Slough/Fibrino Yes Wound Bed Granulation Amount: Large (67-100%) Exposed Structure Granulation Quality: Red, Pink Fascia Exposed: No Necrotic Amount: Small (1-33%) Fat Layer (Subcutaneous Tissue) Exposed: Yes Necrotic Quality: Adherent Slough Tendon Exposed: No Muscle Exposed: No Joint Exposed: No Bone Exposed: No Assessment Notes tunnel at 3 connects to wound #2. Treatment Notes Wound #1 (Upper Leg) Wound Laterality: Left, Medial Cleanser Soap and Water Discharge Instruction: May shower and wash wound with dial antibacterial soap and water prior to dressing change. Peri-Wound Care Topical Primary Dressing Dakin's Solution Discharge Instruction: Moisten gauze with Dakin's Solution pack lightly in the the tunnels and undermining. Secondary Dressing Zetuvit Plus Silicone Border Dressing 7x7(in/in) Discharge Instruction: Apply silicone border foam or ABD pad over primary dressing as directed. Secured With Compression Wrap Compression Stockings Environmental education officer) Signed: 11/24/2021 2:27:55 PM By: Sandre Kitty Signed: 11/24/2021 5:00:25 PM By: Deon Pilling RN, BSN Entered By: Sandre Kitty on 11/24/2021 09:53:28 -------------------------------------------------------------------------------- Wound  Assessment Details Patient Name: Date of Service: Clinton Picket D. 11/24/2021 9:30 A M Medical Record Number: [id number] Patient Account Number: [id number] Date of Birth/Sex: Treating RN: 1986-03-16 (35 y.o. [name] Primary Care Sahith Nurse: PA TIENT, NO Other Clinician: Referring Sabriya Yono: Treating Shaketha Jeon/Extender: Yaakov Guthrie in Treatment: 2 Wound Status Wound Number: 2 Primary Etiology: Abrasion Wound Location: Left, Anterior Upper Leg Wound Status: Open Wounding Event: Trauma Date Acquired: 10/19/2021 Weeks Of Treatment: 2 Clustered Wound: No Photos Wound Measurements Length: (cm) 1 Width: (cm) 3.9 Depth: (cm) 0.6 Area: (cm) 3.063 Volume: (cm) 1.838 % Reduction in Area: -50% % Reduction in Volume: -801% Epithelialization: Small (1-33%) Tunneling: Yes Position (o'clock): 9 Maximum Distance: (cm) 4.8 Wound Description Classification: Full Thickness Without Exposed Support Stru Wound Margin: Distinct, outline attached Exudate Amount: Medium Exudate Type: Serosanguineous Exudate Color: red, brown ctures Foul Odor After Cleansing: No Slough/Fibrino No Wound Bed Granulation Amount: Large (67-100%) Exposed Structure Granulation Quality: Red, Pink Fascia Exposed: No Necrotic Amount: Small (1-33%) Fat Layer (Subcutaneous Tissue) Exposed: Yes Necrotic Quality: Adherent Slough Tendon Exposed: No Muscle Exposed: No Joint Exposed: No Bone Exposed: No Treatment Notes Wound #2 (Upper Leg) Wound Laterality: Left, Anterior Cleanser Soap and Water Discharge Instruction: May shower and wash wound with dial antibacterial soap and water prior to dressing change. Peri-Wound Care Topical Primary Dressing Dakin's Solution Discharge Instruction: Moisten gauze with Dakin's Solution pack lightly in the the tunnels and undermining.  Secondary Dressing Zetuvit Plus Silicone Border Dressing 7x7(in/in) Discharge Instruction: Apply silicone border foam or  ABD pad over primary dressing as directed. Secured With Compression Wrap Compression Stockings Environmental education officer) Signed: 11/24/2021 2:27:55 PM By: Sandre Kitty Signed: 11/24/2021 5:00:25 PM By: Deon Pilling RN, BSN Entered By: Sandre Kitty on 11/24/2021 09:53:53 -------------------------------------------------------------------------------- Vitals Details Patient Name: Date of Service: Clinton Picket D. 11/24/2021 9:30 A M Medical Record Number: [id number] Patient Account Number: [id number] Date of Birth/Sex: Treating RN: Oct 31, 1986 (35 y.o. [name] Primary Care Jalei Shibley: PA TIENT, NO Other Clinician: Referring Kemisha Bonnette: Treating Oma Marzan/Extender: Yaakov Guthrie in Treatment: 2 Vital Signs Time Taken: 09:48 Temperature (F): 98 Height (in): 68 Pulse (bpm): 83 Weight (lbs): 250 Respiratory Rate (breaths/min): 16 Body Mass Index (BMI): 38 Blood Pressure (mmHg): 128/96 Reference Range: 80 - 120 mg / dl Electronic Signature(s) Signed: 11/24/2021 5:00:25 PM By: Deon Pilling RN, BSN Entered By: Deon Pilling on 11/24/2021 09:49:38
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
## 2021-12-01 ENCOUNTER — Other Ambulatory Visit: Payer: Self-pay

## 2021-12-01 ENCOUNTER — Encounter (HOSPITAL_BASED_OUTPATIENT_CLINIC_OR_DEPARTMENT_OTHER): Payer: Self-pay | Admitting: Internal Medicine

## 2021-12-01 DIAGNOSIS — L97822 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ed: Secondary | ICD-10-CM

## 2021-12-01 DIAGNOSIS — T8130XA Disruption of wound, unspecified, initial encounter: Secondary | ICD-10-CM

## 2021-12-01 DIAGNOSIS — W3400XD Accidental discharge from unspecified firearms or gun, subsequent encounter: Secondary | ICD-10-CM
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
## 2021-12-01 NOTE — Progress Notes (Signed)
TIWAN, SCHNITKER ([id number]) Visit Report for 12/01/2021 Arrival Information Details Patient Name: Date of Service: [name] 12/01/2021 10:15 A M Medical Record Number: [id number] Patient Account Number: [id number] Date of Birth/Sex: Treating RN: [birth year] (35 y.o. [name] Primary Care Ioan Landini: PA Haig Prophet, NO Other Clinician: Referring Torianna Junio: Treating Ladanian Kelter/Extender: Yaakov Guthrie in Treatment: 3 Visit Information History Since Last Visit Added or deleted any medications: No Patient Arrived: Ambulatory Any new allergies or adverse reactions: No Arrival Time: 10:30 Had a fall or experienced change in No Accompanied By: self activities of daily living that may affect Transfer Assistance: None risk of falls: Patient Identification Verified: Yes Signs or symptoms of abuse/neglect since last visito No Secondary Verification Process Completed: Yes Hospitalized since last visit: No Patient Requires Transmission-Based Precautions: No Implantable device outside of the clinic excluding No Patient Has Alerts: No cellular tissue based products placed in the center since last visit: Has Dressing in Place as Prescribed: Yes Pain Present Now: No Electronic Signature(s) Signed: 12/01/2021 5:33:19 PM By: Deon Pilling RN, BSN Entered By: Deon Pilling on 12/01/2021 10:38:23 -------------------------------------------------------------------------------- Clinic Level of Care Assessment Details Patient Name: Date of Service: Clinton Picket D. 12/01/2021 10:15 A M Medical Record Number: [id number] Patient Account Number: [id number] Date of Birth/Sex: Treating RN: [birth year] (35 y.o. [name] Primary Care Harolyn Cocker: PA TIENT, NO Other Clinician: Referring Deiona Hooper: Treating Elsbeth Yearick/Extender: Yaakov Guthrie in Treatment: 3 Clinic Level of Care Assessment Items TOOL 4 Quantity Score X- 1 0 Use when only an EandM is performed on  FOLLOW-UP visit ASSESSMENTS - Nursing Assessment / Reassessment X- 1 10 Reassessment of Co-morbidities (includes updates in patient status) X- 1 5 Reassessment of Adherence to Treatment Plan ASSESSMENTS - Wound and Skin A ssessment / Reassessment []  - 0 Simple Wound Assessment / Reassessment - one wound X- 2 5 Complex Wound Assessment / Reassessment - multiple wounds X- 1 10 Dermatologic / Skin Assessment (not related to wound area) ASSESSMENTS - Focused Assessment []  - 0 Circumferential Edema Measurements - multi extremities []  - 0 Nutritional Assessment / Counseling / Intervention []  - 0 Lower Extremity Assessment (monofilament, tuning fork, pulses) []  - 0 Peripheral Arterial Disease Assessment (using hand held doppler) ASSESSMENTS - Ostomy and/or Continence Assessment and Care []  - 0 Incontinence Assessment and Management []  - 0 Ostomy Care Assessment and Management (repouching, etc.) PROCESS - Coordination of Care []  - 0 Simple Patient / Family Education for ongoing care X- 1 20 Complex (extensive) Patient / Family Education for ongoing care X- 1 10 Staff obtains Programmer, systems, Records, T Results / Process Orders est []  - 0 Staff telephones HHA, Nursing Homes / Clarify orders / etc []  - 0 Routine Transfer to another Facility (non-emergent condition) []  - 0 Routine Hospital Admission (non-emergent condition) []  - 0 New Admissions / Biomedical engineer / Ordering NPWT Apligraf, etc. , []  - 0 Emergency Hospital Admission (emergent condition) []  - 0 Simple Discharge Coordination X- 1 15 Complex (extensive) Discharge Coordination PROCESS - Special Needs []  - 0 Pediatric / Minor Patient Management []  - 0 Isolation Patient Management []  - 0 Hearing / Language / Visual special needs []  - 0 Assessment of Community assistance (transportation, D/C planning, etc.) []  - 0 Additional assistance / Altered mentation []  - 0 Support Surface(s) Assessment (bed, cushion,  seat, etc.) INTERVENTIONS - Wound Cleansing / Measurement []  - 0 Simple Wound Cleansing - one wound X- 2 5 Complex Wound Cleansing - multiple  wounds X- 1 5 Wound Imaging (photographs - any number of wounds) []  - 0 Wound Tracing (instead of photographs) []  - 0 Simple Wound Measurement - one wound X- 2 5 Complex Wound Measurement - multiple wounds INTERVENTIONS - Wound Dressings X - Small Wound Dressing one or multiple wounds 2 10 []  - 0 Medium Wound Dressing one or multiple wounds []  - 0 Large Wound Dressing one or multiple wounds []  - 0 Application of Medications - topical []  - 0 Application of Medications - injection INTERVENTIONS - Miscellaneous []  - 0 External ear exam []  - 0 Specimen Collection (cultures, biopsies, blood, body fluids, etc.) []  - 0 Specimen(s) / Culture(s) sent or taken to Lab for analysis []  - 0 Patient Transfer (multiple staff / Civil Service fast streamer / Similar devices) []  - 0 Simple Staple / Suture removal (25 or less) []  - 0 Complex Staple / Suture removal (26 or more) []  - 0 Hypo / Hyperglycemic Management (close monitor of Blood Glucose) []  - 0 Ankle / Brachial Index (ABI) - do not check if billed separately X- 1 5 Vital Signs Has the patient been seen at the hospital within the last three years: Yes Total Score: 130 Level Of Care: New/Established - Level 4 Electronic Signature(s) Signed: 12/01/2021 5:33:19 PM By: Deon Pilling RN, BSN Entered By: Deon Pilling on 12/01/2021 10:47:27 -------------------------------------------------------------------------------- Lower Extremity Assessment Details Patient Name: Date of Service: Clinton Picket D. 12/01/2021 10:15 A M Medical Record Number: [id number] Patient Account Number: [id number] Date of Birth/Sex: Treating RN: Apr 23, 1986 (36 y.o. [name] Primary Care Emmelyn Schmale: PA Darnelle Spangle Other Clinician: Referring Eulon Allnutt: Treating Miu Chiong/Extender: Yaakov Guthrie in Treatment:  3 Electronic Signature(s) Signed: 12/01/2021 5:33:19 PM By: Deon Pilling RN, BSN Entered By: Deon Pilling on 12/01/2021 10:39:03 -------------------------------------------------------------------------------- Multi Wound Chart Details Patient Name: Date of Service: Clinton Picket D. 12/01/2021 10:15 A M Medical Record Number: [id number] Patient Account Number: [id number] Date of Birth/Sex: Treating RN: [birth year] (35 y.o. [name] Primary Care Elija Mccamish: PA Haig Prophet, NO Other Clinician: Referring Jenice Leiner: Treating Sareena Odeh/Extender: Yaakov Guthrie in Treatment: 3 Vital Signs Height(in): 68 Pulse(bpm): 2 Weight(lbs): 250 Blood Pressure(mmHg): 122/81 Body Mass Index(BMI): 38 Temperature(F): 97.4 Respiratory Rate(breaths/min): 20 Photos: [1:No Photos Left, Medial Upper Leg] [2:No Photos Left, Anterior Upper Leg] [N/A:N/A N/A] Wound Location: [1:Trauma] [2:Trauma] [N/A:N/A] Wounding Event: [1:Abrasion] [2:Abrasion] [N/A:N/A] Primary Etiology: [1:10/19/2021] [2:10/19/2021] [N/A:N/A] Date Acquired: [1:3] [2:3] [N/A:N/A] Weeks of Treatment: [1:Open] [2:Open] [N/A:N/A] Wound Status: [1:0.6x2.1x0.9] [2:0.9x2.7x0.5] [N/A:N/A] Measurements L x W x D (cm) [1:0.99] [2:1.909] [N/A:N/A] A (cm) : rea [1:0.891] [2:0.954] [N/A:N/A] Volume (cm) : [1:77.50%] [2:6.50%] [N/A:N/A] % Reduction in A rea: [1:81.60%] [2:-367.60%] [N/A:N/A] % Reduction in Volume: [1:3] [2:6] Position 1 (o'clock): [1:3.7] [2:3.7] Maximum Distance 1 (cm): [1:4] Position 2 (o'clock): [1:2.4] Maximum Distance 2 (cm): [1:Yes] [2:Yes] [N/A:N/A] Tunneling: [1:Full Thickness Without Exposed] [2:Full Thickness Without Exposed] [N/A:N/A] Classification: [1:Support Structures Medium] [2:Support Structures Medium] [N/A:N/A] Exudate Amount: [1:Serosanguineous] [2:Serosanguineous] [N/A:N/A] Exudate Type: [1:red, brown] [2:red, brown] [N/A:N/A] Exudate Color: [1:Distinct, outline attached] [2:Distinct,  outline attached] [N/A:N/A] Wound Margin: [1:Large (67-100%)] [2:Large (67-100%)] [N/A:N/A] Granulation Amount: [1:Red, Pink] [2:Red, Pink] [N/A:N/A] Granulation Quality: [1:Small (1-33%)] [2:Small (1-33%)] [N/A:N/A] Necrotic Amount: [1:Fat Layer (Subcutaneous Tissue): Yes Fat Layer (Subcutaneous Tissue): Yes N/A] Exposed Structures: [1:Fascia: No Tendon: No Muscle: No Joint: No Bone: No Medium (34-66%)] [2:Fascia: No Tendon: No Muscle: No Joint: No Bone: No Small (1-33%)] [N/A:N/A] Treatment Notes Electronic Signature(s) Signed: 12/01/2021 10:54:00 AM By: [name]  DO Signed: 12/01/2021 5:33:19 PM By: Deon Pilling RN, BSN Entered By: [name] on 12/01/2021 10:48:56 -------------------------------------------------------------------------------- Multi-Disciplinary Care Plan Details Patient Name: Date of Service: Clinton Picket D. 12/01/2021 10:15 A M Medical Record Number: [id number] Patient Account Number: [id number] Date of Birth/Sex: Treating RN: 10-02-1986 (35 y.o. [name] Primary Care Louvenia Golomb: PA Haig Prophet, NO Other Clinician: Referring Malinda Mayden: Treating Newton Frutiger/Extender: Yaakov Guthrie in Treatment: 3 Active Inactive Pain, Acute or Chronic Nursing Diagnoses: Pain, acute or chronic: actual or potential Potential alteration in comfort, pain Goals: Patient will verbalize adequate pain control and receive pain control interventions during procedures as needed Date Initiated: 11/10/2021 Target Resolution Date: 01/07/2022 Goal Status: Active Patient/caregiver will verbalize comfort level met Date Initiated: 11/10/2021 Target Resolution Date: 01/07/2022 Goal Status: Active Interventions: Encourage patient to take pain medications as prescribed Provide education on pain management Reposition patient for comfort Treatment Activities: Administer pain control measures as ordered : 11/10/2021 Notes: Wound/Skin Impairment Nursing  Diagnoses: Knowledge deficit related to ulceration/compromised skin integrity Goals: Patient/caregiver will verbalize understanding of skin care regimen Date Initiated: 11/10/2021 Target Resolution Date: 12/30/2021 Goal Status: Active Interventions: Assess ulceration(s) every visit Provide education on smoking Provide education on ulcer and skin care Treatment Activities: Skin care regimen initiated : 11/10/2021 Topical wound management initiated : 11/10/2021 Notes: Electronic Signature(s) Signed: 12/01/2021 5:33:19 PM By: Deon Pilling RN, BSN Entered By: Deon Pilling on 12/01/2021 10:46:17 -------------------------------------------------------------------------------- Pain Assessment Details Patient Name: Date of Service: Clinton Picket D. 12/01/2021 10:15 A M Medical Record Number: [id number] Patient Account Number: [id number] Date of Birth/Sex: Treating RN: [birth year] (35 y.o. [name] Primary Care Yashas Camilli: PA Haig Prophet, NO Other Clinician: Referring Rakesha Dalporto: Treating Emilynn Srinivasan/Extender: Yaakov Guthrie in Treatment: 3 Active Problems Location of Pain Severity and Description of Pain Patient Has Paino No Site Locations Rate the pain. Current Pain Level: 0 Pain Management and Medication Current Pain Management: Medication: No Cold Application: No Rest: No Massage: No Activity: No T.E.N.S.: No Heat Application: No Leg drop or elevation: No Is the Current Pain Management Adequate: Adequate How does your wound impact your activities of daily livingo Sleep: No Bathing: No Appetite: No Relationship With Others: No Bladder Continence: No Emotions: No Bowel Continence: No Work: No Toileting: No Drive: No Dressing: No Hobbies: No Engineer, maintenance) Signed: 12/01/2021 5:33:19 PM By: Deon Pilling RN, BSN Entered By: Deon Pilling on 12/01/2021  10:38:59 -------------------------------------------------------------------------------- Patient/Caregiver Education Details Patient Name: Date of Service: Clinton Picket D. 12/13/2022andnbsp10:15 A M Medical Record Number: [id number] Patient Account Number: [id number] Date of Birth/Gender: Treating RN: November 25, 1986 (35 y.o. [name] Primary Care Physician: PA Haig Prophet, NO Other Clinician: Referring Physician: Treating Physician/Extender: Yaakov Guthrie in Treatment: 3 Education Assessment Education Provided To: Patient Education Topics Provided Wound/Skin Impairment: Handouts: Caring for Your Ulcer Methods: Explain/Verbal Responses: Reinforcements needed Electronic Signature(s) Signed: 12/01/2021 5:33:19 PM By: Deon Pilling RN, BSN Entered By: Deon Pilling on 12/01/2021 10:46:29 -------------------------------------------------------------------------------- Wound Assessment Details Patient Name: Date of Service: Clinton Picket D. 12/01/2021 10:15 A M Medical Record Number: [id number] Patient Account Number: [id number] Date of Birth/Sex: Treating RN: [birth year] (35 y.o. [name] Primary Care Deysha Cartier: PA Haig Prophet, NO Other Clinician: Referring Kaili Castille: Treating Cartha Rotert/Extender: Yaakov Guthrie in Treatment: 3 Wound Status Wound Number: 1 Primary Etiology: Abrasion Wound Location: Left, Medial Upper Leg Wound Status: Open Wounding Event: Trauma Date Acquired: 10/19/2021 Weeks Of Treatment: 3 Clustered Wound: No Wound Measurements Length: (cm)  0.6 Width: (cm) 2.1 Depth: (cm) 0.9 Area: (cm) 0.99 Volume: (cm) 0.891 % Reduction in Area: 77.5% % Reduction in Volume: 81.6% Epithelialization: Medium (34-66%) Tunneling: Yes Location 1 Position (o'clock): 3 Maximum Distance: (cm) 3.7 Location 2 Position (o'clock): 4 Maximum Distance: (cm) 2.4 Undermining: No Wound Description Classification: Full Thickness Without Exposed  Support Structures Wound Margin: Distinct, outline attached Exudate Amount: Medium Exudate Type: Serosanguineous Exudate Color: red, brown Foul Odor After Cleansing: No Slough/Fibrino Yes Wound Bed Granulation Amount: Large (67-100%) Exposed Structure Granulation Quality: Red, Pink Fascia Exposed: No Necrotic Amount: Small (1-33%) Fat Layer (Subcutaneous Tissue) Exposed: Yes Necrotic Quality: Adherent Slough Tendon Exposed: No Muscle Exposed: No Joint Exposed: No Bone Exposed: No Electronic Signature(s) Signed: 12/01/2021 5:33:19 PM By: Deon Pilling RN, BSN Entered By: Deon Pilling on 12/01/2021 10:44:43 -------------------------------------------------------------------------------- Wound Assessment Details Patient Name: Date of Service: Clinton Picket D. 12/01/2021 10:15 A M Medical Record Number: [id number] Patient Account Number: [id number] Date of Birth/Sex: Treating RN: Jan 21, 1986 (35 y.o. [name] Primary Care Asna Muldrow: PA TIENT, NO Other Clinician: Referring Oaklyn Mans: Treating Grizel Vesely/Extender: Yaakov Guthrie in Treatment: 3 Wound Status Wound Number: 2 Primary Etiology: Abrasion Wound Location: Left, Anterior Upper Leg Wound Status: Open Wounding Event: Trauma Date Acquired: 10/19/2021 Weeks Of Treatment: 3 Clustered Wound: No Wound Measurements Length: (cm) 0.9 Width: (cm) 2.7 Depth: (cm) 0.5 Area: (cm) 1.909 Volume: (cm) 0.954 % Reduction in Area: 6.5% % Reduction in Volume: -367.6% Epithelialization: Small (1-33%) Tunneling: Yes Position (o'clock): 6 Maximum Distance: (cm) 3.7 Undermining: No Wound Description Classification: Full Thickness Without Exposed Support Structures Wound Margin: Distinct, outline attached Exudate Amount: Medium Exudate Type: Serosanguineous Exudate Color: red, brown Foul Odor After Cleansing: No Slough/Fibrino Yes Wound Bed Granulation Amount: Large (67-100%) Exposed Structure Granulation  Quality: Red, Pink Fascia Exposed: No Necrotic Amount: Small (1-33%) Fat Layer (Subcutaneous Tissue) Exposed: Yes Necrotic Quality: Adherent Slough Tendon Exposed: No Muscle Exposed: No Joint Exposed: No Bone Exposed: No Electronic Signature(s) Signed: 12/01/2021 5:33:19 PM By: Deon Pilling RN, BSN Entered By: Deon Pilling on 12/01/2021 10:41:01 -------------------------------------------------------------------------------- Vitals Details Patient Name: Date of Service: Clinton Picket D. 12/01/2021 10:15 A M Medical Record Number: [id number] Patient Account Number: [id number] Date of Birth/Sex: Treating RN: 11/24/1986 (35 y.o. [name] Primary Care Brodin Gelpi: PA TIENT, NO Other Clinician: Referring Sofya Moustafa: Treating Freddie Nghiem/Extender: Yaakov Guthrie in Treatment: 3 Vital Signs Time Taken: 10:30 Temperature (F): 97.4 Height (in): 68 Pulse (bpm): 87 Weight (lbs): 250 Respiratory Rate (breaths/min): 20 Body Mass Index (BMI): 38 Blood Pressure (mmHg): 122/81 Reference Range: 80 - 120 mg / dl Electronic Signature(s) Signed: 12/01/2021 5:33:19 PM By: Deon Pilling RN, BSN Entered By: Deon Pilling on 12/01/2021 10:38:48

## 2021-12-17 ENCOUNTER — Encounter (HOSPITAL_BASED_OUTPATIENT_CLINIC_OR_DEPARTMENT_OTHER): Payer: Self-pay | Admitting: Internal Medicine

## 2022-08-30 ENCOUNTER — Ambulatory Visit: Payer: Self-pay | Admitting: Family Medicine
